# Patient Record
Sex: Female | Born: 1950 | Race: White | Hispanic: No | Smoking: Never smoker
Health system: Southern US, Community
[De-identification: ages and names within clinical notes are randomized; demographics above are authoritative.]

## PROBLEM LIST (undated history)

## (undated) DIAGNOSIS — I10 Essential (primary) hypertension: Secondary | ICD-10-CM

## (undated) DIAGNOSIS — K5792 Diverticulitis of intestine, part unspecified, without perforation or abscess without bleeding: Secondary | ICD-10-CM

## (undated) DIAGNOSIS — E119 Type 2 diabetes mellitus without complications: Secondary | ICD-10-CM

## (undated) HISTORY — PX: CHOLECYSTECTOMY: SHX55

---

## 1999-05-28 DIAGNOSIS — I2510 Atherosclerotic heart disease of native coronary artery without angina pectoris: Secondary | ICD-10-CM | POA: Diagnosis present

## 2006-05-12 ENCOUNTER — Other Ambulatory Visit: Payer: Self-pay

## 2006-05-12 ENCOUNTER — Emergency Department: Payer: Self-pay | Admitting: Emergency Medicine

## 2006-05-14 ENCOUNTER — Ambulatory Visit: Payer: Self-pay | Admitting: Emergency Medicine

## 2006-05-30 ENCOUNTER — Emergency Department: Payer: Self-pay | Admitting: Emergency Medicine

## 2006-05-31 ENCOUNTER — Inpatient Hospital Stay: Payer: Self-pay | Admitting: General Surgery

## 2006-05-31 ENCOUNTER — Other Ambulatory Visit: Payer: Self-pay

## 2010-04-11 ENCOUNTER — Ambulatory Visit: Payer: Self-pay | Admitting: Internal Medicine

## 2012-02-26 ENCOUNTER — Ambulatory Visit: Payer: Self-pay | Admitting: Family Medicine

## 2012-03-06 ENCOUNTER — Ambulatory Visit: Payer: Self-pay | Admitting: Family Medicine

## 2012-09-07 ENCOUNTER — Ambulatory Visit: Payer: Self-pay | Admitting: Family Medicine

## 2013-01-06 ENCOUNTER — Emergency Department: Payer: Self-pay | Admitting: Emergency Medicine

## 2013-01-06 LAB — CBC
HCT: 45.1 % (ref 35.0–47.0)
HGB: 15.8 g/dL (ref 12.0–16.0)
MCH: 31.7 pg (ref 26.0–34.0)
MCHC: 35.1 g/dL (ref 32.0–36.0)

## 2013-01-06 LAB — BASIC METABOLIC PANEL
BUN: 11 mg/dL (ref 7–18)
Co2: 30 mmol/L (ref 21–32)
Creatinine: 0.79 mg/dL (ref 0.60–1.30)
EGFR (African American): >60
Osmolality: 286 (ref 275–301)
Potassium: 3.5 mmol/L (ref 3.5–5.1)

## 2013-01-06 LAB — TROPONIN I
Troponin-I: <0.02 ng/mL
Troponin-I: <0.02 ng/mL

## 2013-03-18 ENCOUNTER — Ambulatory Visit: Payer: Self-pay | Admitting: Family Medicine

## 2013-10-25 DIAGNOSIS — I214 Non-ST elevation (NSTEMI) myocardial infarction: Secondary | ICD-10-CM | POA: Insufficient documentation

## 2013-10-25 DIAGNOSIS — R748 Abnormal levels of other serum enzymes: Secondary | ICD-10-CM | POA: Insufficient documentation

## 2014-10-28 DIAGNOSIS — E782 Mixed hyperlipidemia: Secondary | ICD-10-CM | POA: Insufficient documentation

## 2014-10-28 DIAGNOSIS — I10 Essential (primary) hypertension: Secondary | ICD-10-CM | POA: Insufficient documentation

## 2015-11-13 DIAGNOSIS — E782 Mixed hyperlipidemia: Secondary | ICD-10-CM | POA: Diagnosis not present

## 2015-11-13 DIAGNOSIS — I10 Essential (primary) hypertension: Secondary | ICD-10-CM | POA: Diagnosis not present

## 2020-03-01 ENCOUNTER — Encounter: Payer: Self-pay | Admitting: Podiatry

## 2020-03-01 ENCOUNTER — Ambulatory Visit: Payer: Medicare Other | Admitting: Podiatry

## 2020-03-01 ENCOUNTER — Other Ambulatory Visit: Payer: Self-pay

## 2020-03-01 DIAGNOSIS — T148XXA Other injury of unspecified body region, initial encounter: Secondary | ICD-10-CM | POA: Diagnosis not present

## 2020-03-01 DIAGNOSIS — Z1881 Retained glass fragments: Secondary | ICD-10-CM

## 2020-03-01 DIAGNOSIS — L02612 Cutaneous abscess of left foot: Secondary | ICD-10-CM

## 2020-03-01 DIAGNOSIS — L03032 Cellulitis of left toe: Secondary | ICD-10-CM

## 2020-03-01 DIAGNOSIS — W25XXXA Contact with sharp glass, initial encounter: Secondary | ICD-10-CM

## 2020-03-01 MED ORDER — CEFADROXIL 500 MG PO CAPS: 500.0000 mg | ORAL_CAPSULE | Freq: Two times a day (BID) | ORAL | 0 refills | Status: AC

## 2020-03-01 NOTE — Patient Instructions (Signed)
Monitor for any signs/symptoms of infection. Signs of an infection could be redness beyond the site of the incision/procedure/wound, foul smelling odor, drainage that is thick and yellow or green, or severe swelling and pain. Call the office immediately if any occur or go directly to the emergency room. Call with any questions/concerns.   Apply neosporin or antibiotic ointment and a bandaid daily

## 2020-03-01 NOTE — Progress Notes (Signed)
  Subjective:  Patient ID: Ann Harris, female    DOB: [birth year],  MRN: [id number]  Chief Complaint  Patient presents with  . Callouses    Patient presents today for painful callous lesion bottom of left hallux x 2-3 weeks ago    69 y.o. female presents with the above complaint. History confirmed with patient. Thinks she injured it by stepping on an open suitcase lying on the ground. Pain has been significant  Objective:  Physical Exam: warm, good capillary refill, no trophic changes or ulcerative lesions, normal DP and PT pulses and normal sensory exam. Left Foot: hallux plantar IPJ there is a small blister/abscess with serous drainage, on debridement reveals a small piece of glass, mild erythema and edema of hallux, granular wound base   Assessment:   1. Glass foreign body in skin   2. Cellulitis and abscess of toe of left foot      Plan:  Patient was evaluated and treated and all questions answered.   - Upon debridement of the lesion and exploration of the wound a small piece of glass was easily removed without complication. No further sinus or puncture was noted that warranted further exploration and I feel confident the entire FB was removed  - Rx for duricef for cellulitis x10 days  - Apply neosporin daily with bandage  - Return in 2 weeks for evaluation  Return in about 2 weeks (around 03/15/2020) for wound re-check.

## 2020-03-15 ENCOUNTER — Encounter: Payer: Self-pay | Admitting: Podiatry

## 2020-03-15 ENCOUNTER — Other Ambulatory Visit: Payer: Self-pay

## 2020-03-15 ENCOUNTER — Ambulatory Visit: Payer: Medicare Other | Admitting: Podiatry

## 2020-03-15 DIAGNOSIS — L03032 Cellulitis of left toe: Secondary | ICD-10-CM | POA: Diagnosis not present

## 2020-03-15 DIAGNOSIS — L02612 Cutaneous abscess of left foot: Secondary | ICD-10-CM | POA: Diagnosis not present

## 2020-03-15 DIAGNOSIS — W25XXXA Contact with sharp glass, initial encounter: Secondary | ICD-10-CM | POA: Diagnosis not present

## 2020-03-15 DIAGNOSIS — T148XXA Other injury of unspecified body region, initial encounter: Secondary | ICD-10-CM

## 2020-03-15 NOTE — Progress Notes (Signed)
  Subjective:  Patient ID: Ann Harris, female    DOB: 1950/11/24,  MRN: [id number]  Chief Complaint  Patient presents with  . Wound Check    "its getting better, but now there is a white spot on my toe and it doesn't hurt but its hard to still bend my toe"    70 y.o. female returns for follow-up with the above complaint. History confirmed with patient.  She thinks it may have blistered near the wound.  Feels much better than it did before the glass was taken out.  Objective:  Physical Exam: warm, good capillary refill, no trophic changes or ulcerative lesions, normal DP and PT pulses and normal sensory exam. Left Foot: Prior puncture wound is healing well with mild hyperkeratosis around this, lateral to this there is a small blister   Assessment:   1. Glass foreign body in skin   2. Cellulitis and abscess of toe of left foot      Plan:  Patient was evaluated and treated and all questions answered.   -Deroofed the serous blister and applied Betadine.  Expect this to resolve uneventfully.  Advised to discontinue Neosporin and Band-Aid application and begin to put lotion on this starting tonight.  - Return in 2 weeks for evaluation  Return if symptoms worsen or fail to improve.

## 2020-06-13 ENCOUNTER — Emergency Department
Admission: EM | Admit: 2020-06-13 | Discharge: 2020-06-14 | Disposition: A | Discharge status: Home / Self Care | Payer: Medicare Other | Attending: Emergency Medicine | Admitting: Emergency Medicine

## 2020-06-13 ENCOUNTER — Other Ambulatory Visit: Payer: Self-pay

## 2020-06-13 ENCOUNTER — Emergency Department: Payer: Medicare Other

## 2020-06-13 DIAGNOSIS — Z79899 Other long term (current) drug therapy: Secondary | ICD-10-CM | POA: Diagnosis not present

## 2020-06-13 DIAGNOSIS — E876 Hypokalemia: Secondary | ICD-10-CM | POA: Insufficient documentation

## 2020-06-13 DIAGNOSIS — R739 Hyperglycemia, unspecified: Secondary | ICD-10-CM | POA: Diagnosis not present

## 2020-06-13 DIAGNOSIS — I2510 Atherosclerotic heart disease of native coronary artery without angina pectoris: Secondary | ICD-10-CM | POA: Insufficient documentation

## 2020-06-13 DIAGNOSIS — Z7982 Long term (current) use of aspirin: Secondary | ICD-10-CM | POA: Diagnosis not present

## 2020-06-13 DIAGNOSIS — I119 Hypertensive heart disease without heart failure: Secondary | ICD-10-CM | POA: Diagnosis not present

## 2020-06-13 DIAGNOSIS — K5792 Diverticulitis of intestine, part unspecified, without perforation or abscess without bleeding: Secondary | ICD-10-CM

## 2020-06-13 DIAGNOSIS — R1084 Generalized abdominal pain: Secondary | ICD-10-CM | POA: Insufficient documentation

## 2020-06-13 HISTORY — DX: Essential (primary) hypertension: I10

## 2020-06-13 LAB — CBC
HCT: 44.2 % (ref 36.0–46.0)
Hemoglobin: 15.3 g/dL — ABNORMAL HIGH (ref 12.0–15.0)
MCH: 31.4 pg (ref 26.0–34.0)
MCHC: 34.6 g/dL (ref 30.0–36.0)
MCV: 90.8 fL (ref 80.0–100.0)
Platelets: 197 10*3/uL (ref 150–400)
RBC: 4.87 MIL/uL (ref 3.87–5.11)
RDW: 11.9 % (ref 11.5–15.5)
WBC: 5.9 10*3/uL (ref 4.0–10.5)
nRBC: 0 % (ref 0.0–0.2)

## 2020-06-13 LAB — URINALYSIS, COMPLETE (UACMP) WITH MICROSCOPIC
Bacteria, UA: NONE SEEN
Bilirubin Urine: NEGATIVE
Glucose, UA: >=500 mg/dL — AB
Ketones, ur: NEGATIVE mg/dL
Leukocytes,Ua: NEGATIVE
Nitrite: NEGATIVE
Protein, ur: 30 mg/dL — AB
Specific Gravity, Urine: 1.033 — ABNORMAL HIGH (ref 1.005–1.030)
pH: 5 (ref 5.0–8.0)

## 2020-06-13 LAB — COMPREHENSIVE METABOLIC PANEL
ALT: 24 U/L (ref 0–44)
AST: 28 U/L (ref 15–41)
Albumin: 3.4 g/dL — ABNORMAL LOW (ref 3.5–5.0)
Alkaline Phosphatase: 109 U/L (ref 38–126)
Anion gap: 11 (ref 5–15)
BUN: 11 mg/dL (ref 8–23)
CO2: 29 mmol/L (ref 22–32)
Calcium: 9.2 mg/dL (ref 8.9–10.3)
Chloride: 95 mmol/L — ABNORMAL LOW (ref 98–111)
Creatinine, Ser: 0.64 mg/dL (ref 0.44–1.00)
GFR, Estimated: >60 mL/min (ref >60)
Glucose, Bld: 291 mg/dL — ABNORMAL HIGH (ref 70–99)
Potassium: 2.9 mmol/L — ABNORMAL LOW (ref 3.5–5.1)
Sodium: 135 mmol/L (ref 135–145)
Total Bilirubin: 0.9 mg/dL (ref 0.3–1.2)
Total Protein: 7.6 g/dL (ref 6.5–8.1)

## 2020-06-13 LAB — MAGNESIUM: Magnesium: 1.9 mg/dL (ref 1.7–2.4)

## 2020-06-13 LAB — LACTIC ACID, PLASMA: Lactic Acid, Venous: 1.7 mmol/L (ref 0.5–1.9)

## 2020-06-13 LAB — HEMOGLOBIN A1C
Hgb A1c MFr Bld: 10 % — ABNORMAL HIGH (ref 4.8–5.6)
Mean Plasma Glucose: 240.3 mg/dL

## 2020-06-13 LAB — TROPONIN I (HIGH SENSITIVITY): Troponin I (High Sensitivity): 9 ng/L (ref <18)

## 2020-06-13 LAB — LIPASE, BLOOD: Lipase: 23 U/L (ref 11–51)

## 2020-06-13 MED ORDER — POTASSIUM CHLORIDE CRYS ER 20 MEQ PO TBCR
80.0000 meq | EXTENDED_RELEASE_TABLET | Freq: Once | ORAL | Status: AC
  Administered 2020-06-13: 80 meq via ORAL
  Filled 2020-06-13: qty 4

## 2020-06-13 MED ORDER — CIPROFLOXACIN HCL 500 MG PO TABS
500.0000 mg | ORAL_TABLET | Freq: Once | ORAL | Status: AC
  Administered 2020-06-13: 500 mg via ORAL
  Filled 2020-06-13: qty 1

## 2020-06-13 MED ORDER — LACTATED RINGERS IV BOLUS
500.0000 mL | Freq: Once | INTRAVENOUS | Status: AC
  Administered 2020-06-13: 500 mL via INTRAVENOUS

## 2020-06-13 MED ORDER — CIPROFLOXACIN HCL 500 MG PO TABS: 500.0000 mg | ORAL_TABLET | Freq: Two times a day (BID) | ORAL | 0 refills | Status: AC

## 2020-06-13 MED ORDER — LACTATED RINGERS IV BOLUS
1000.0000 mL | Freq: Once | INTRAVENOUS | Status: AC
  Administered 2020-06-13: 1000 mL via INTRAVENOUS

## 2020-06-13 MED ORDER — ONDANSETRON 4 MG PO TBDP
4.0000 mg | ORAL_TABLET | Freq: Once | ORAL | Status: AC
  Administered 2020-06-13: 4 mg via ORAL
  Filled 2020-06-13: qty 1

## 2020-06-13 MED ORDER — IOHEXOL 300 MG/ML  SOLN
100.0000 mL | Freq: Once | INTRAMUSCULAR | Status: AC | PRN
  Administered 2020-06-13: 100 mL via INTRAVENOUS
  Filled 2020-06-13: qty 100

## 2020-06-13 MED ORDER — METOPROLOL TARTRATE 50 MG PO TABS
50.0000 mg | ORAL_TABLET | Freq: Once | ORAL | Status: AC
  Administered 2020-06-14: 50 mg via ORAL
  Filled 2020-06-13: qty 1

## 2020-06-13 MED ORDER — ONDANSETRON 4 MG PO TBDP: 4.0000 mg | ORAL_TABLET | Freq: Three times a day (TID) | ORAL | 0 refills | Status: DC | PRN

## 2020-06-13 MED ORDER — METRONIDAZOLE 500 MG PO TABS: 500.0000 mg | ORAL_TABLET | Freq: Three times a day (TID) | ORAL | 0 refills | Status: AC

## 2020-06-13 MED ORDER — METFORMIN HCL 500 MG PO TABS: ORAL_TABLET | ORAL | 0 refills | Status: DC

## 2020-06-13 MED ORDER — METRONIDAZOLE 500 MG PO TABS
500.0000 mg | ORAL_TABLET | Freq: Once | ORAL | Status: AC
  Administered 2020-06-13: 500 mg via ORAL
  Filled 2020-06-13: qty 1

## 2020-06-13 NOTE — ED Notes (Signed)
Pt requested to leave prior to completing IVF. Pt sts she will hydrate well at home. Pt able to tolerate water without vomiting and very mild nausea.

## 2020-06-13 NOTE — ED Provider Notes (Signed)
[facility] Emergency Department Provider Note  ____________________________________________   Event Date/Time   First MD Initiated Contact with Patient 06/13/20 1825     (approximate)  I have reviewed the triage vital signs and the nursing notes.   HISTORY  Chief Complaint Abdominal Pain   HPI Ann Harris is a 70 y.o. female with past medical history of HTN, HDL, and CAD with no known history of diabetes who presents for assessment of 3 to 4 days of generalized crampy intermittent abdominal pain.  Patient states he has been having regular bowel movements and has not had any nausea, vomiting or diarrhea.  She denies any urinary symptoms.  Denies any back pain, chest pain, cough, shortness of breath, headache, earache, sore throat, rash or extremity pain.  No recent falls or injuries.  Denies regular NSAID use or EtOH use.  She has had a cholecystectomy in the past but no other significant abdominal surgeries.  No clearly getting aggravating factors.          Past Medical History:  Diagnosis Date  . Hypertension     Patient Active Problem List   Diagnosis Date Noted  . Benign essential hypertension 10/28/2014  . Hyperlipidemia, mixed 10/28/2014  . Low serum HDL 10/25/2013  . Non-Q wave myocardial infarction (HCC) 10/25/2013    Past Surgical History:  Procedure Laterality Date  . CHOLECYSTECTOMY      Prior to Admission medications   Medication Sig Start Date End Date Taking? Authorizing Provider  aspirin 81 MG EC tablet Take by mouth.    [provider]  hydrochlorothiazide (HYDRODIURIL) 25 MG tablet Take by mouth. 02/24/20   [provider]  metoprolol tartrate (LOPRESSOR) 50 MG tablet Take by mouth. 02/24/20   [provider]    Allergies Clopidogrel, Lisinopril, and Losartan  History reviewed. No pertinent family history.  Social History Social History   Tobacco Use  . Smoking status: Never Smoker  .  Smokeless tobacco: Never Used  Substance Use Topics  . Alcohol use: Yes    Comment: occasional glass of wine  . Drug use: Never    Review of Systems  Review of Systems  Constitutional: Negative for chills and fever.  HENT: Negative for sore throat.   Eyes: Negative for pain.  Respiratory: Negative for cough and stridor.   Cardiovascular: Negative for chest pain.  Gastrointestinal: Positive for abdominal pain. Negative for vomiting.  Genitourinary: Negative for dysuria.  Musculoskeletal: Negative for myalgias.  Skin: Negative for rash.  Neurological: Negative for seizures, loss of consciousness and headaches.  Psychiatric/Behavioral: Negative for suicidal ideas.  All other systems reviewed and are negative.    ____________________________________________   PHYSICAL EXAM:  VITAL SIGNS: ED Triage Vitals  Enc Vitals Group     BP 06/13/20 1717 (!) 147/73     Pulse Rate 06/13/20 1717 87     Resp 06/13/20 1717 17     Temp 06/13/20 1717 99.6 F (37.6 C)     Temp Source 06/13/20 1717 Oral     SpO2 06/13/20 1717 93 %     Weight 06/13/20 1717 177 lb (80.3 kg)     Height 06/13/20 1717 5\' 4"  (1.626 m)     Head Circumference --      Peak Flow --      Pain Score 06/13/20 1727 10     Pain Loc --      Pain Edu? --      Excl. in GC? --  Vitals:   06/13/20 1717  BP: (!) 147/73  Pulse: 87  Resp: 17  Temp: 99.6 F (37.6 C)  SpO2: 93%   Physical Exam Vitals and nursing note reviewed.  Constitutional:      General: She is not in acute distress.    Appearance: She is well-developed and well-nourished.  HENT:     Head: Normocephalic and atraumatic.     Right Ear: External ear normal.     Left Ear: External ear normal.     Nose: Nose normal.     Mouth/Throat:     Mouth: Mucous membranes are moist.  Eyes:     Conjunctiva/sclera: Conjunctivae normal.  Cardiovascular:     Rate and Rhythm: Normal rate and regular rhythm.     Heart sounds: No murmur heard.   Pulmonary:      Effort: Pulmonary effort is normal. No respiratory distress.     Breath sounds: Normal breath sounds.  Abdominal:     Palpations: Abdomen is soft.     Tenderness: There is generalized abdominal tenderness. There is no right CVA tenderness or left CVA tenderness.  Musculoskeletal:        General: No edema.     Cervical back: Neck supple.  Skin:    General: Skin is warm and dry.  Neurological:     Mental Status: She is alert and oriented to person, place, and time.  Psychiatric:        Mood and Affect: Mood and affect and mood normal.      ____________________________________________   LABS (all labs ordered are listed, but only abnormal results are displayed)  Labs Reviewed  COMPREHENSIVE METABOLIC PANEL - Abnormal; Notable for the following components:      Result Value   Potassium 2.9 (*)    Chloride 95 (*)    Glucose, Bld 291 (*)    Albumin 3.4 (*)    All other components within normal limits  CBC - Abnormal; Notable for the following components:   Hemoglobin 15.3 (*)    All other components within normal limits  URINALYSIS, COMPLETE (UACMP) WITH MICROSCOPIC - Abnormal; Notable for the following components:   Color, Urine AMBER (*)    APPearance HAZY (*)    Specific Gravity, Urine 1.033 (*)    Glucose, UA >=500 (*)    Hgb urine dipstick MODERATE (*)    Protein, ur 30 (*)    All other components within normal limits  LIPASE, BLOOD  MAGNESIUM  LACTIC ACID, PLASMA  HEMOGLOBIN A1C  TROPONIN I (HIGH SENSITIVITY)   ____________________________________________  EKG  Sinus rhythm with a ventricular rate of 81, normal axis, unremarkable intervals, no clear evidence of acute ischemia. ____________________________________________  RADIOLOGY  ED MD interpretation:   Official radiology report(s): No results found.  ____________________________________________   PROCEDURES  Procedure(s) performed (including Critical  Care):  Procedures   ____________________________________________   INITIAL IMPRESSION / ASSESSMENT AND PLAN / ED COURSE      Patient presents with above to history exam for assessment of worsening generalized crampy abdominal pain has been intermittent over the last couple days.  On arrival she is hypertensive otherwise stable vital signs on room air.  On exam patient appears in no acute distress her abdomen is soft and mildly tender throughout.  Differential includes but is not limited to gastritis, pancreatitis, cholecystitis, cystitis, pyelonephritis, SBO, diverticulitis, appendicitis, enteritis, atypical presentation of ACS, and kidney stone  Lipase of 23 not consistent with acute pancreatitis.  CMP remarkable for hypokalemia  with a K of 2.9 as well as hyperglycemia with a glucose of 291 with no significant electrolyte or metabolic derangements.  No evidence of cholestasis, hepatitis or acidosis.  No evidence of DKA.  Given absence of focal tenderness in right upper quadrant and reassuring CMP low suspicion for choledocholithiasis or other obstructive biliary pathology at this time.  CBC is unremarkable for leukocytosis.  UA has greater than 500 glucose and moderate blood as well as 3 protein but no clear evidence of acute infection.  Low suspicion for cystitis given patient denies any urinary symptoms.  She is no CVA tenderness, fever or elevated white blood cell count to suggest pyelonephritis.  Low suspicion for atypical presentation of ACS given she denies any chest pain and has troponin of 9 pain greater than 3 hours after symptom onset.  Lactic acid is 1.7.  Low suspicion for sepsis perforated viscus or ischemic colitis at this time.  Given hyperglycemia noted on CMP with no history of diabetes A1c was sent.  Patient's potassium was repleted.  She was also given a small bolus of IV fluids.  Care patient signed over to Dr. Erma Heritage at approximately 8 PM.  Plan is to follow-up CT and  reassess.  If CT is reassuring and patient continues to have soft abdomen likely safe for discharge with outpatient PCP follow-up for follow-up of A1c and sugar rechecked.    ____________________________________________   FINAL CLINICAL IMPRESSION(S) / ED DIAGNOSES  Final diagnoses:  Generalized abdominal pain  Hypokalemia  Hyperglycemia    Medications  potassium chloride SA (KLOR-CON) CR tablet 80 mEq (80 mEq Oral Given 06/13/20 1858)  lactated ringers bolus 500 mL (0 mLs Intravenous Stopped 06/13/20 1936)  iohexol (OMNIPAQUE) 300 MG/ML solution 100 mL (100 mLs Intravenous Contrast Given 06/13/20 1944)     ED Discharge Orders    None       Note:  This document was prepared using Dragon voice recognition software and may include unintentional dictation errors.   Gilles Chiquito, MD 06/13/20 [phone]
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
## 2020-06-13 NOTE — Discharge Instructions (Addendum)
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 While this is likely related to infection, your A1C, a marker of diabetes, was elevated as well. This suggests your sugar has been high for the last several months.  For now, we're going to prescribe metformin, a diabetic medication. Start this after the course of antibiotics, as it can cause some stomach/GI upset.  It is VERY important to follow-up with your primary doctor in the next 1 week

## 2020-06-13 NOTE — ED Provider Notes (Signed)
Assumed care of this patient at 9 PM.  Briefly, plan to follow-up CT scan with disposition if no surgical abnormality is noted.  The patient is here with mild abdominal pain, nausea, and reported bloating.  Hypokalemia noted but patient has been given replacement therapy.  She is nontoxic.  Lactic acid is normal without evidence of sepsis.  CT imaging reviewed, shows jejunal diverticulitis.  This was discussed with her in detail and will refer her to GI.  At this time, given absence of any complications or systemic signs of illness or sepsis, will attempt outpatient management with Cipro and Flagyl.  She was given a first dose of medications here.  Of note, her blood sugar was elevated on initial BMP so an A1c was sent, which returned elevated.  She has no evidence of diabetic ketoacidosis today.  She was given fluids here.  Heart rate was initially mildly elevated but this is improved with fluids and she is tolerating p.o.  Suspect this is been a somewhat ongoing issue given her A1c, and will have her follow-up with a PCP.  She was advised to call the [facility] clinic for setting up an appointment.  Do think that she merits treatment with metformin but will have her hold this until after her current episode of diverticulosis is over, to prevent GI effects.    Shaune Pollack, MD 06/13/20 2250

## 2020-06-29 ENCOUNTER — Other Ambulatory Visit: Payer: Self-pay

## 2021-08-14 ENCOUNTER — Other Ambulatory Visit: Payer: Self-pay | Admitting: Internal Medicine

## 2021-08-14 DIAGNOSIS — Z1231 Encounter for screening mammogram for malignant neoplasm of breast: Secondary | ICD-10-CM

## 2021-10-26 ENCOUNTER — Other Ambulatory Visit: Payer: Self-pay

## 2021-10-26 NOTE — Progress Notes (Signed)
Pre-employment drug screen cleared.

## 2022-08-26 IMAGING — CT CT ABD-PELV W/ CM
2 of 5 series · 16 of 46 positions shown, 18 images · IV contrast (APPLIED)
Comparison: None.

CLINICAL DATA: Abdominal pain.

EXAM:
CT ABDOMEN AND PELVIS WITH CONTRAST
TECHNIQUE: Multidetector CT imaging of the abdomen and pelvis was performed
using the standard protocol following bolus administration of
intravenous contrast.
CONTRAST:  100mL OMNIPAQUE IOHEXOL 300 MG/ML  SOLN

[Series 2: axial st · axial · 0.86mm/px · z∈[-720,-250]mm · 13 of 106 slices shown, 15 images]
[im 6/106  soft-tissue]
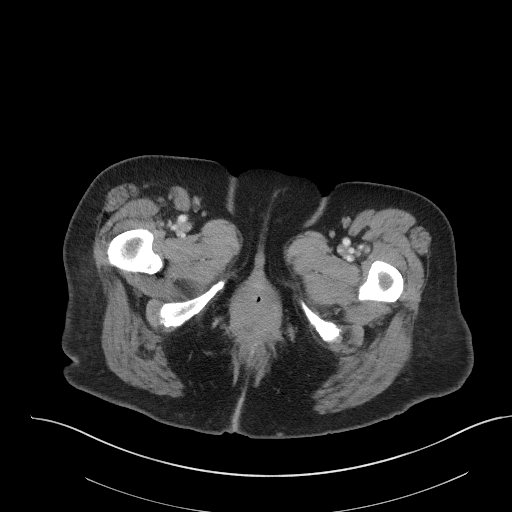
[im 6/106  bone]
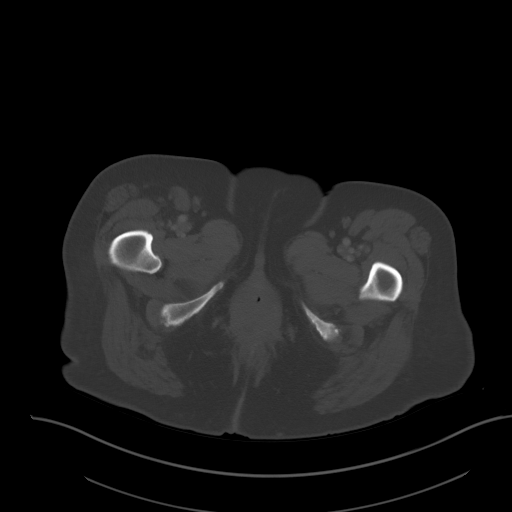
[im 17/106  soft-tissue]
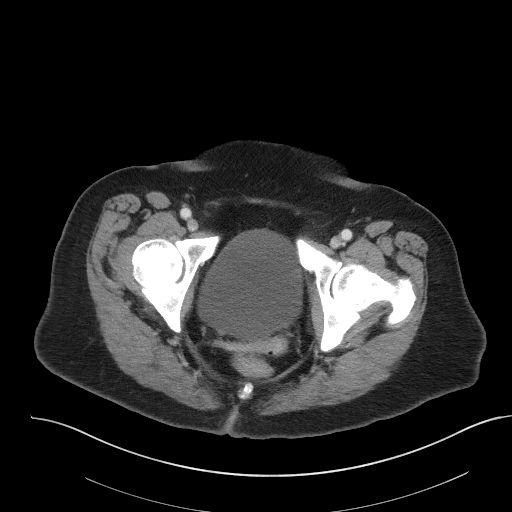
[im 23/106  soft-tissue]
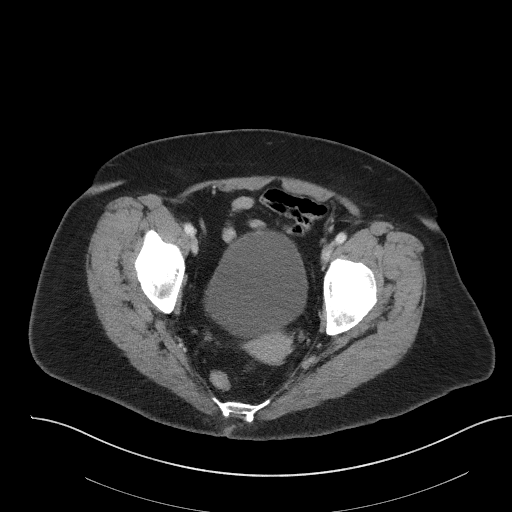
[im 28/106  soft-tissue]
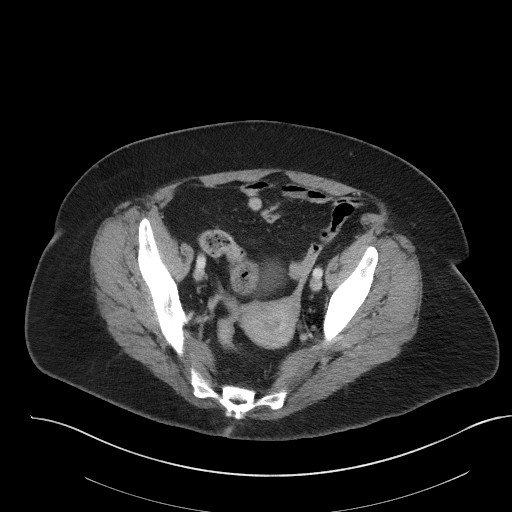
[im 39/106  soft-tissue]
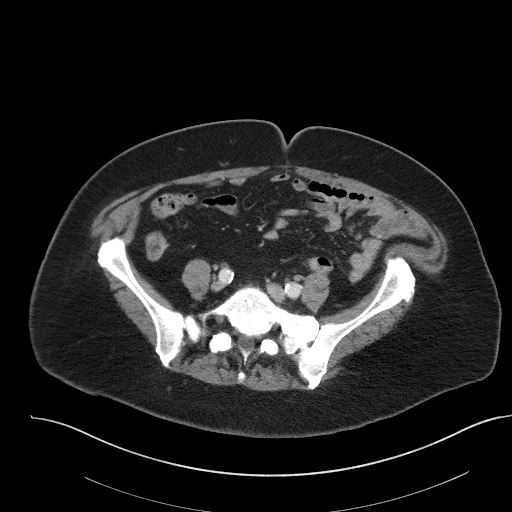
[im 45/106  soft-tissue]
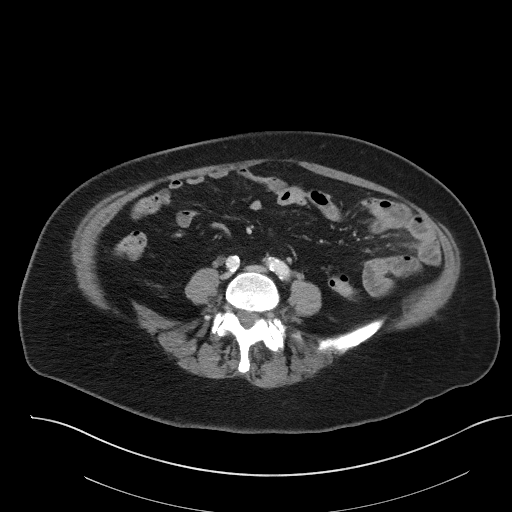
[im 56/106  soft-tissue]
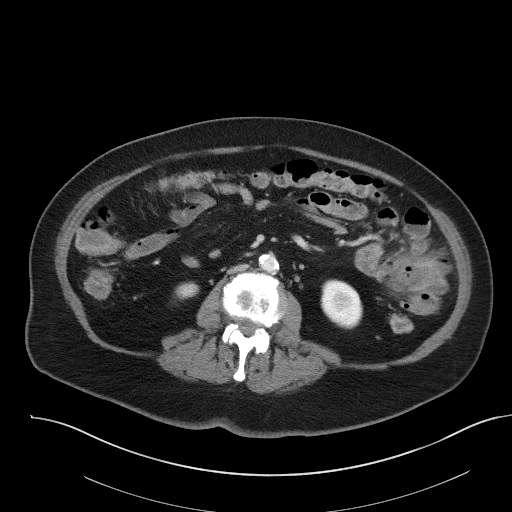
[im 61/106  soft-tissue]
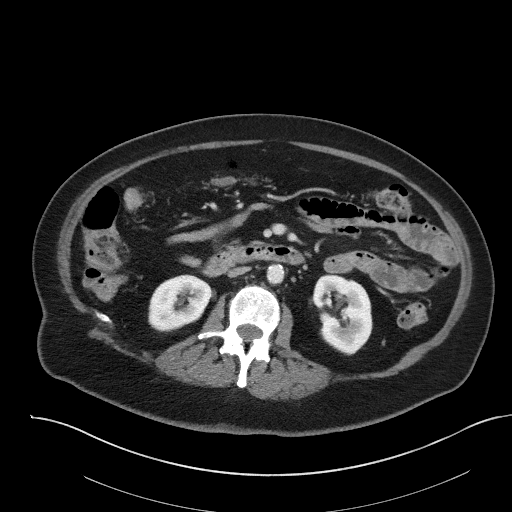
[im 67/106  soft-tissue]
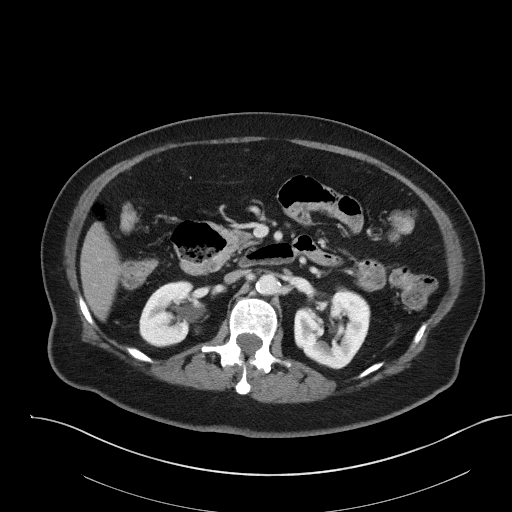
[im 67/106  bone]
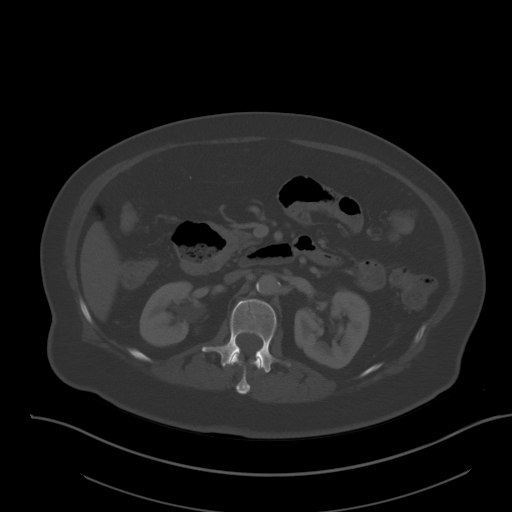
[im 78/106  soft-tissue]
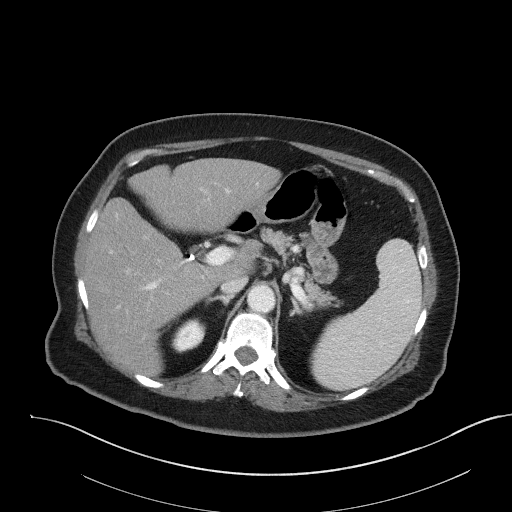
[im 83/106  soft-tissue]
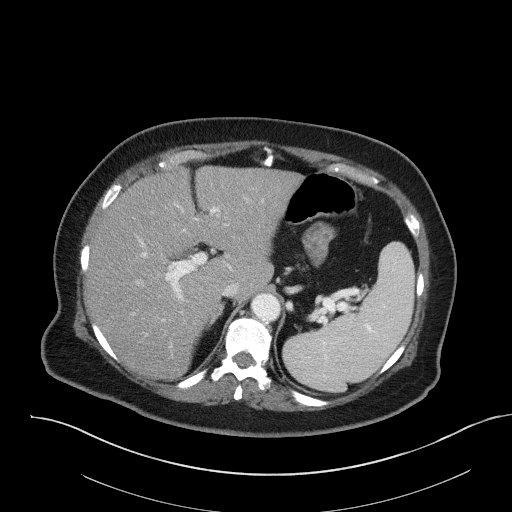
[im 89/106  soft-tissue]
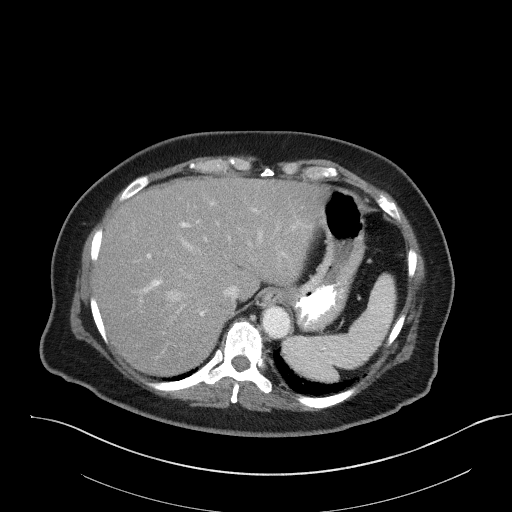
[im 100/106  soft-tissue]
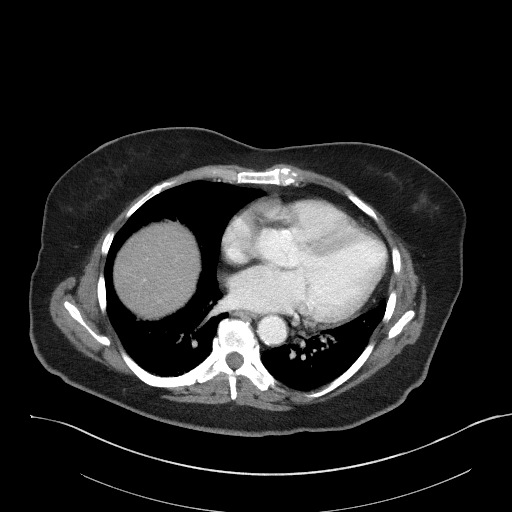

[Series 5: coronal st · coronal · 0.82mm/px · 3 of 88 slices shown]
[im 30/88  soft-tissue]
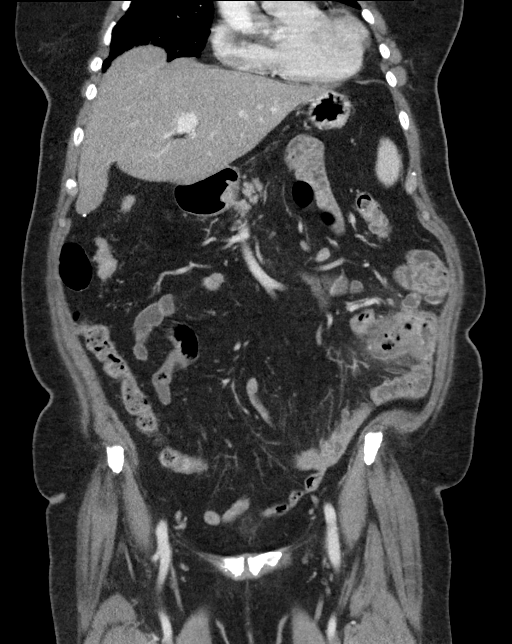
[im 39/88  soft-tissue]
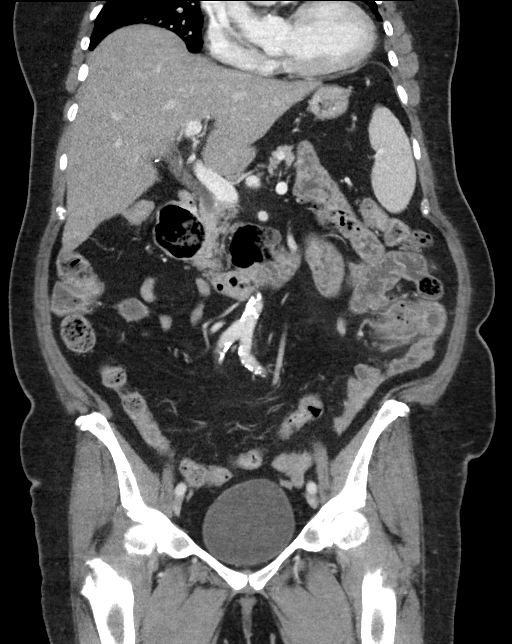
[im 49/88  soft-tissue]
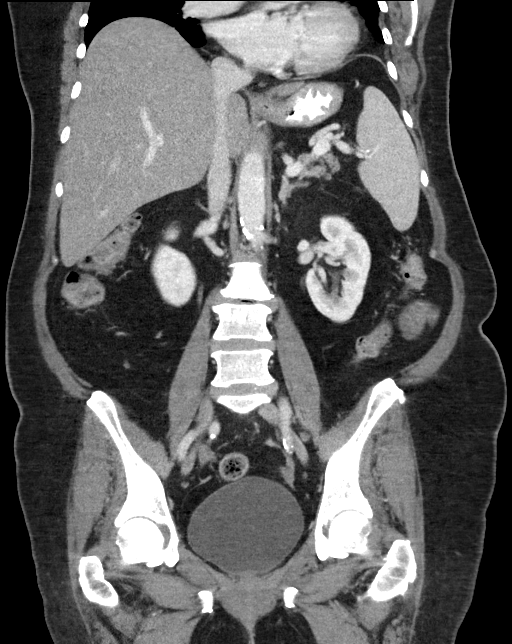

[16 of 46 positions shown; findings below may reference images not displayed]

FINDINGS: Lower chest: No acute abnormality.

Hepatobiliary: There is mild diffuse fatty infiltration of the liver
parenchyma. No focal liver abnormality is seen. Status post
cholecystectomy. No biliary dilatation.

Pancreas: Unremarkable. No pancreatic ductal dilatation or
surrounding inflammatory changes.

Spleen: Normal in size without focal abnormality.

Adrenals/Urinary Tract: Adrenal glands are unremarkable. Kidneys are
normal, without renal calculi, focal lesion, or hydronephrosis.
Bladder is unremarkable.

Stomach/Bowel: Stomach is within normal limits. Appendix appears
normal. No evidence of bowel dilatation. A large, markedly thickened
and inflamed jejunal diverticulum is seen within the lateral aspect
of the mid left abdomen (axial CT images 51 through 60, CT series
number 2). This is adjacent to a segment of mildly thickened small
bowel.

Vascular/Lymphatic: Aortic atherosclerosis. No enlarged abdominal or
pelvic lymph nodes.

Reproductive: Uterus and bilateral adnexa are unremarkable.

Other: No abdominal wall hernia or abnormality. No abdominopelvic
ascites.

Musculoskeletal: Degenerative changes are seen within the lumbar
spine, most prominent at the level of L2-L3.
IMPRESSION: 1. Large, markedly thickened and inflamed jejunal diverticulum.
2. Evidence of prior cholecystectomy.
3. Aortic atherosclerosis.

Aortic Atherosclerosis ([id number]-[id number].A).

## 2023-09-15 ENCOUNTER — Other Ambulatory Visit: Payer: Self-pay | Admitting: Internal Medicine

## 2023-09-15 DIAGNOSIS — Z1231 Encounter for screening mammogram for malignant neoplasm of breast: Secondary | ICD-10-CM

## 2023-09-30 ENCOUNTER — Ambulatory Visit
Admission: RE | Admit: 2023-09-30 | Discharge: 2023-09-30 | Disposition: A | Discharge status: Home / Self Care | Source: Ambulatory Visit | Attending: Internal Medicine | Admitting: Internal Medicine

## 2023-09-30 DIAGNOSIS — Z1231 Encounter for screening mammogram for malignant neoplasm of breast: Secondary | ICD-10-CM | POA: Insufficient documentation

## 2023-11-13 ENCOUNTER — Emergency Department

## 2023-11-13 ENCOUNTER — Encounter: Payer: Self-pay | Admitting: Emergency Medicine

## 2023-11-13 ENCOUNTER — Inpatient Hospital Stay
Admission: EM | Admit: 2023-11-13 | Discharge: 2023-11-16 | DRG: 392 | Disposition: A | Discharge status: Home / Self Care | Attending: Student in an Organized Health Care Education/Training Program | Admitting: Student in an Organized Health Care Education/Training Program

## 2023-11-13 ENCOUNTER — Other Ambulatory Visit: Payer: Self-pay

## 2023-11-13 DIAGNOSIS — I2510 Atherosclerotic heart disease of native coronary artery without angina pectoris: Secondary | ICD-10-CM | POA: Diagnosis present

## 2023-11-13 DIAGNOSIS — Z79899 Other long term (current) drug therapy: Secondary | ICD-10-CM

## 2023-11-13 DIAGNOSIS — Z9049 Acquired absence of other specified parts of digestive tract: Secondary | ICD-10-CM | POA: Diagnosis not present

## 2023-11-13 DIAGNOSIS — J9601 Acute respiratory failure with hypoxia: Secondary | ICD-10-CM | POA: Diagnosis not present

## 2023-11-13 DIAGNOSIS — K5710 Diverticulosis of small intestine without perforation or abscess without bleeding: Secondary | ICD-10-CM

## 2023-11-13 DIAGNOSIS — Z7984 Long term (current) use of oral hypoglycemic drugs: Secondary | ICD-10-CM | POA: Diagnosis not present

## 2023-11-13 DIAGNOSIS — E119 Type 2 diabetes mellitus without complications: Secondary | ICD-10-CM | POA: Diagnosis present

## 2023-11-13 DIAGNOSIS — I700 Atherosclerosis of aorta: Secondary | ICD-10-CM | POA: Diagnosis present

## 2023-11-13 DIAGNOSIS — Z7982 Long term (current) use of aspirin: Secondary | ICD-10-CM

## 2023-11-13 DIAGNOSIS — K5720 Diverticulitis of large intestine with perforation and abscess without bleeding: Secondary | ICD-10-CM | POA: Diagnosis present

## 2023-11-13 DIAGNOSIS — E876 Hypokalemia: Secondary | ICD-10-CM | POA: Diagnosis present

## 2023-11-13 DIAGNOSIS — Z888 Allergy status to other drugs, medicaments and biological substances status: Secondary | ICD-10-CM

## 2023-11-13 DIAGNOSIS — J9811 Atelectasis: Secondary | ICD-10-CM | POA: Diagnosis not present

## 2023-11-13 DIAGNOSIS — R1010 Upper abdominal pain, unspecified: Principal | ICD-10-CM

## 2023-11-13 DIAGNOSIS — I10 Essential (primary) hypertension: Secondary | ICD-10-CM | POA: Diagnosis present

## 2023-11-13 DIAGNOSIS — K5700 Diverticulitis of small intestine with perforation and abscess without bleeding: Principal | ICD-10-CM | POA: Diagnosis present

## 2023-11-13 HISTORY — DX: Diverticulitis of intestine, part unspecified, without perforation or abscess without bleeding: K57.92

## 2023-11-13 HISTORY — DX: Type 2 diabetes mellitus without complications: E11.9

## 2023-11-13 LAB — URINALYSIS, ROUTINE W REFLEX MICROSCOPIC
Bilirubin Urine: NEGATIVE
Glucose, UA: NEGATIVE mg/dL
Hgb urine dipstick: NEGATIVE
Ketones, ur: NEGATIVE mg/dL
Nitrite: POSITIVE — AB
Protein, ur: NEGATIVE mg/dL
Specific Gravity, Urine: 1.018 (ref 1.005–1.030)
pH: 6 (ref 5.0–8.0)

## 2023-11-13 LAB — GLUCOSE, CAPILLARY
Glucose-Capillary: 124 mg/dL — ABNORMAL HIGH (ref 70–99)
Glucose-Capillary: 115 mg/dL — ABNORMAL HIGH (ref 70–99)

## 2023-11-13 LAB — TROPONIN I (HIGH SENSITIVITY)
Troponin I (High Sensitivity): 6 ng/L (ref <18)
Troponin I (High Sensitivity): 6 ng/L (ref <18)

## 2023-11-13 LAB — CBC WITH DIFFERENTIAL/PLATELET
Abs Immature Granulocytes: 0.03 10*3/uL (ref 0.00–0.07)
Basophils Absolute: 0 10*3/uL (ref 0.0–0.1)
Basophils Relative: 0 %
Eosinophils Absolute: 0.1 10*3/uL (ref 0.0–0.5)
Eosinophils Relative: 2 %
HCT: 47.7 % — ABNORMAL HIGH (ref 36.0–46.0)
Hemoglobin: 16.1 g/dL — ABNORMAL HIGH (ref 12.0–15.0)
Immature Granulocytes: 0 %
Lymphocytes Relative: 20 %
Lymphs Abs: 1.7 10*3/uL (ref 0.7–4.0)
MCH: 31.3 pg (ref 26.0–34.0)
MCHC: 33.8 g/dL (ref 30.0–36.0)
MCV: 92.6 fL (ref 80.0–100.0)
Monocytes Absolute: 0.3 10*3/uL (ref 0.1–1.0)
Monocytes Relative: 3 %
Neutro Abs: 6.4 10*3/uL (ref 1.7–7.7)
Neutrophils Relative %: 75 %
Platelets: 188 10*3/uL (ref 150–400)
RBC: 5.15 MIL/uL — ABNORMAL HIGH (ref 3.87–5.11)
RDW: 12.6 % (ref 11.5–15.5)
WBC: 8.5 10*3/uL (ref 4.0–10.5)
nRBC: 0 % (ref 0.0–0.2)

## 2023-11-13 LAB — COMPREHENSIVE METABOLIC PANEL WITH GFR
ALT: 22 U/L (ref 0–44)
AST: 21 U/L (ref 15–41)
Albumin: 4.1 g/dL (ref 3.5–5.0)
Alkaline Phosphatase: 43 U/L (ref 38–126)
Anion gap: 10 (ref 5–15)
BUN: 12 mg/dL (ref 8–23)
CO2: 30 mmol/L (ref 22–32)
Calcium: 9.9 mg/dL (ref 8.9–10.3)
Chloride: 101 mmol/L (ref 98–111)
Creatinine, Ser: 0.63 mg/dL (ref 0.44–1.00)
GFR, Estimated: >60 mL/min (ref >60)
Glucose, Bld: 133 mg/dL — ABNORMAL HIGH (ref 70–99)
Potassium: 2.9 mmol/L — ABNORMAL LOW (ref 3.5–5.1)
Sodium: 141 mmol/L (ref 135–145)
Total Bilirubin: 0.8 mg/dL (ref 0.0–1.2)
Total Protein: 7.3 g/dL (ref 6.5–8.1)

## 2023-11-13 LAB — CBG MONITORING, ED: Glucose-Capillary: 140 mg/dL — ABNORMAL HIGH (ref 70–99)

## 2023-11-13 LAB — LIPASE, BLOOD: Lipase: 34 U/L (ref 11–51)

## 2023-11-13 LAB — MAGNESIUM: Magnesium: 2 mg/dL (ref 1.7–2.4)

## 2023-11-13 MED ORDER — SODIUM CHLORIDE 0.9 % IV BOLUS (SEPSIS)
1000.0000 mL | Freq: Once | INTRAVENOUS | Status: AC
  Administered 2023-11-13: 1000 mL via INTRAVENOUS

## 2023-11-13 MED ORDER — PIPERACILLIN-TAZOBACTAM 3.375 G IVPB
3.3750 g | Freq: Once | INTRAVENOUS | Status: AC
  Administered 2023-11-13: 3.375 g via INTRAVENOUS
  Filled 2023-11-13: qty 50

## 2023-11-13 MED ORDER — ONDANSETRON HCL 4 MG/2ML IJ SOLN
4.0000 mg | Freq: Once | INTRAMUSCULAR | Status: AC
  Administered 2023-11-13: 4 mg via INTRAVENOUS
  Filled 2023-11-13: qty 2

## 2023-11-13 MED ORDER — KETOROLAC TROMETHAMINE 30 MG/ML IJ SOLN
15.0000 mg | Freq: Once | INTRAMUSCULAR | Status: AC
  Administered 2023-11-13: 15 mg via INTRAVENOUS
  Filled 2023-11-13: qty 1

## 2023-11-13 MED ORDER — MORPHINE SULFATE (PF) 2 MG/ML IV SOLN: 2.0000 mg | INTRAVENOUS | Status: DC | PRN

## 2023-11-13 MED ORDER — HYDRALAZINE HCL 20 MG/ML IJ SOLN: 10.0000 mg | INTRAMUSCULAR | Status: DC | PRN

## 2023-11-13 MED ORDER — HYDROCHLOROTHIAZIDE 25 MG PO TABS
25.0000 mg | ORAL_TABLET | Freq: Every day | ORAL | Status: DC
  Administered 2023-11-13 – 2023-11-16 (×4): 25 mg via ORAL
  Filled 2023-11-13 (×5): qty 1

## 2023-11-13 MED ORDER — OXYCODONE HCL 5 MG PO TABS
5.0000 mg | ORAL_TABLET | ORAL | Status: DC | PRN
  Administered 2023-11-13: 10 mg via ORAL
  Administered 2023-11-14: 5 mg via ORAL
  Administered 2023-11-14 – 2023-11-15 (×2): 10 mg via ORAL
  Filled 2023-11-13 (×4): qty 2

## 2023-11-13 MED ORDER — OXYCODONE HCL 5 MG PO TABS
5.0000 mg | ORAL_TABLET | Freq: Once | ORAL | Status: AC
  Administered 2023-11-13: 5 mg via ORAL
  Filled 2023-11-13: qty 1

## 2023-11-13 MED ORDER — INSULIN ASPART 100 UNIT/ML IJ SOLN
0.0000 [IU] | Freq: Three times a day (TID) | INTRAMUSCULAR | Status: DC
  Administered 2023-11-14: 2 [IU] via SUBCUTANEOUS
  Administered 2023-11-14: 3 [IU] via SUBCUTANEOUS
  Filled 2023-11-13 (×2): qty 1

## 2023-11-13 MED ORDER — POTASSIUM CHLORIDE 10 MEQ/100ML IV SOLN
10.0000 meq | INTRAVENOUS | Status: AC
  Administered 2023-11-13 (×2): 10 meq via INTRAVENOUS
  Filled 2023-11-13 (×2): qty 100

## 2023-11-13 MED ORDER — IOHEXOL 300 MG/ML  SOLN
100.0000 mL | Freq: Once | INTRAMUSCULAR | Status: AC | PRN
  Administered 2023-11-13: 100 mL via INTRAVENOUS

## 2023-11-13 MED ORDER — LACTATED RINGERS IV SOLN: INTRAVENOUS | Status: DC

## 2023-11-13 MED ORDER — ACETAMINOPHEN 325 MG PO TABS
650.0000 mg | ORAL_TABLET | Freq: Four times a day (QID) | ORAL | Status: DC | PRN
  Administered 2023-11-13 – 2023-11-15 (×2): 650 mg via ORAL
  Filled 2023-11-13 (×2): qty 2

## 2023-11-13 MED ORDER — ONDANSETRON HCL 4 MG/2ML IJ SOLN: 4.0000 mg | Freq: Four times a day (QID) | INTRAMUSCULAR | Status: DC | PRN

## 2023-11-13 MED ORDER — ACETAMINOPHEN 650 MG RE SUPP: 650.0000 mg | Freq: Four times a day (QID) | RECTAL | Status: DC | PRN

## 2023-11-13 MED ORDER — ONDANSETRON 4 MG PO TBDP: 4.0000 mg | ORAL_TABLET | Freq: Four times a day (QID) | ORAL | Status: DC | PRN

## 2023-11-13 MED ORDER — PIPERACILLIN-TAZOBACTAM 3.375 G IVPB
3.3750 g | Freq: Three times a day (TID) | INTRAVENOUS | Status: DC
  Administered 2023-11-13 – 2023-11-16 (×9): 3.375 g via INTRAVENOUS
  Filled 2023-11-13 (×10): qty 50

## 2023-11-13 MED ORDER — ASPIRIN 81 MG PO TBEC
81.0000 mg | DELAYED_RELEASE_TABLET | Freq: Every day | ORAL | Status: DC
  Administered 2023-11-13 – 2023-11-16 (×4): 81 mg via ORAL
  Filled 2023-11-13 (×5): qty 1

## 2023-11-13 MED ORDER — POTASSIUM CHLORIDE CRYS ER 20 MEQ PO TBCR
40.0000 meq | EXTENDED_RELEASE_TABLET | Freq: Once | ORAL | Status: AC
  Administered 2023-11-13: 40 meq via ORAL
  Filled 2023-11-13: qty 2

## 2023-11-13 MED ORDER — METOPROLOL TARTRATE 50 MG PO TABS
50.0000 mg | ORAL_TABLET | Freq: Two times a day (BID) | ORAL | Status: DC
  Administered 2023-11-13 – 2023-11-16 (×7): 50 mg via ORAL
  Filled 2023-11-13 (×8): qty 1

## 2023-11-13 NOTE — ED Notes (Signed)
 Pt to radiology.

## 2023-11-13 NOTE — ED Provider Notes (Signed)
 7:44 AM Assumed care for off going team.   Blood pressure (!) 174/86, pulse 65, temperature 98.3 F (36.8 C), temperature source Oral, resp. rate 16, height 5' 3.5 (1.613 m), weight 75.8 kg, SpO2 98%.  See their HPI for full report but in brief pending CT  I reviewed CT personally interpreted and patient does have diverticulitis waiting for read.  1. Examination is positive for acute diverticulitis involving the  mid small bowel. Small foci of extraluminal gas noted within the  area of inflammation which may reflect contained perforation. No  signs of free perforation noted however. No abscess identified.  2. Small volume of free fluid noted within the abdomen and pelvis.  3. Stable appearance of large duodenal diverticulum.  4. Status post cholecystectomy. Stable dilatation of the common bile  duct without significant intrahepatic bile duct dilatation. Findings  likely reflect post cholecystectomy physiology.  5.  Aortic Atherosclerosis (ICD10-I70.0).    I did let Dr. Mauri Sous know about patient.  Her repeat abdominal exam is without rebound, guarding.  Does report 5 out of 10 pain.  Will give a dose of oxycodone.  Given the contained perforation I did recommend admission to the hospital for serial abdominal exams, IV antibiotics.  Patient was agreeable.  Will discuss the hospitalist for admission       Lubertha Rush, MD 11/13/23 [phone]

## 2023-11-13 NOTE — Consult Note (Addendum)
 [facility] SURGICAL CONSULTATION NOTE (initial) - cpt: 96045   HISTORY OF PRESENT ILLNESS (HPI):  73 y.o. female presented to [facility] ED today for evaluation of abdominal pain. Patient reports she awoke this morning around 0100 with central abdominal discomfort. At first, she thought this may be food poisoning from dinner last night which was lobster ravioli. However, the pain continued to progress which prompted her presentation to the ED. She denied any fever, chills, emesis, CP, SOB, or urinary changes. Of note, she has a history of small bowel diverticulitis back in 2022 which was managed with PO Abx. She thinks she has had a few flare ups since then but never needed Abx or hospitalization. Previous abdominal surgeries positive for open cholecystectomy. She is colonoscopy naive. Work up in the ED revealed a normal WBC to 8.5K, Hgb to 16.1, sCr - 0.63, hypokalemia to 2.9. She did have CT Abdomen/Pelvis which was concerning for small bowel diverticulitis with very small foci of air, no massive pneumoperitoneum.   Surgery is consulted by emergency medicine physician Dr. Mike Alcon, MD in this context for evaluation and management of small bowel diverticulitis.   PAST MEDICAL HISTORY (PMH):  Past Medical History:  Diagnosis Date   Hypertension      PAST SURGICAL HISTORY (PSH):  Past Surgical History:  Procedure Laterality Date   CHOLECYSTECTOMY       MEDICATIONS:  Prior to Admission medications   Medication Sig Start Date End Date Taking? Authorizing Provider  aspirin 81 MG EC tablet Take by mouth.    [provider]  hydrochlorothiazide (HYDRODIURIL) 25 MG tablet Take by mouth. 02/24/20   [provider]  metFORMIN  (GLUCOPHAGE ) 500 MG tablet Take 1 tablet (500 mg total) by mouth 2 (two) times daily with a meal for 7 days, THEN 2 tablets (1,000 mg total) 2 (two) times daily with a meal for 23 days. You can increase to 1000 mg twice a day if you have no  abdominal pain, diarrhea, or other side effects.. 06/13/20 07/13/20  Loman Risk, MD  metoprolol  tartrate (LOPRESSOR ) 50 MG tablet Take by mouth. 02/24/20   [provider]  ondansetron  (ZOFRAN  ODT) 4 MG disintegrating tablet Take 1 tablet (4 mg total) by mouth every 8 (eight) hours as needed for nausea or vomiting. 06/13/20   Loman Risk, MD     ALLERGIES:  Allergies  Allergen Reactions   Clopidogrel Other (See Comments)    bruising    Lisinopril Cough    headaches   Losartan Diarrhea and Other (See Comments)    Sensitivity to light      SOCIAL HISTORY:  Social History   Socioeconomic History   Marital status: Widowed    Spouse name: Not on file   Number of children: Not on file   Years of education: Not on file   Highest education level: Not on file  Occupational History   Not on file  Tobacco Use   Smoking status: Never   Smokeless tobacco: Never  Substance and Sexual Activity   Alcohol use: Yes    Comment: occasional glass of wine   Drug use: Never   Sexual activity: Not on file  Other Topics Concern   Not on file  Social History Narrative   Not on file   Social Drivers of Health   Financial Resource Strain: Low Risk  (05/04/2023)   Received from [facility]   Overall Financial Resource Strain (CARDIA)    Difficulty of Paying Living  Expenses: Not hard at all  Food Insecurity: No Food Insecurity (05/04/2023)   Received from [facility]   Hunger Vital Sign    Within the past 12 months, you worried that your food would run out before you got the money to buy more.: Never true    Within the past 12 months, the food you bought just didn't last and you didn't have money to get more.: Never true  Transportation Needs: No Transportation Needs (05/04/2023)   Received from [facility] - Transportation    In the past 12 months, has lack of transportation kept you from medical appointments or from  getting medications?: No    Lack of Transportation (Non-Medical): No  Physical Activity: Not on file  Stress: Not on file  Social Connections: Not on file  Intimate Partner Violence: Not on file     FAMILY HISTORY:  History reviewed. No pertinent family history.    REVIEW OF SYSTEMS:  Review of Systems  Constitutional:  Negative for chills and fever.  Respiratory:  Negative for cough and shortness of breath.   Cardiovascular:  Negative for chest pain and palpitations.  Gastrointestinal:  Positive for abdominal pain. Negative for constipation, diarrhea, nausea and vomiting.  Genitourinary:  Negative for dysuria and urgency.  All other systems reviewed and are negative.   VITAL SIGNS:  Temp:  [98.3 F (36.8 C)] 98.3 F (36.8 C) (06/19 0547) Pulse Rate:  [65-87] 87 (06/19 0700) Resp:  [16-20] 20 (06/19 0700) BP: (125-174)/(76-86) 125/76 (06/19 0700) SpO2:  [93 %-98 %] 93 % (06/19 0700) Weight:  [75.8 kg] 75.8 kg (06/19 0528)     Height: 5' 3.5 (161.3 cm) Weight: 75.8 kg BMI (Calculated): 29.12   INTAKE/OUTPUT:  No intake/output data recorded.  PHYSICAL EXAM:  Physical Exam Vitals and nursing note reviewed. Exam conducted with a chaperone present.  Constitutional:      General: She is not in acute distress.    Appearance: She is well-developed and normal weight. She is not ill-appearing.     Comments: Sitting up in bed; NAD  HENT:     Head: Normocephalic and atraumatic.   Eyes:     General: No scleral icterus.    Extraocular Movements: Extraocular movements intact.    Cardiovascular:     Rate and Rhythm: Normal rate.     Heart sounds: Normal heart sounds. No murmur heard. Pulmonary:     Effort: Pulmonary effort is normal. No respiratory distress.  Abdominal:     General: A surgical scar is present. There is no distension.     Palpations: Abdomen is soft.     Tenderness: There is abdominal tenderness in the periumbilical area. There is no guarding or rebound.      Comments: Abdomen is soft, she is tender periumbilically, non-distended, no rebound/guarding. She is not overtly peritonitic  Genitourinary:    Comments: Deferred  Skin:    General: Skin is warm and dry.   Neurological:     General: No focal deficit present.     Mental Status: She is alert and oriented to person, place, and time.   Psychiatric:        Attention and Perception: Attention normal.        Mood and Affect: Mood is anxious.      Labs:     Latest Ref Rng & Units 11/13/2023    5:52 AM 06/13/2020    5:30 PM 01/06/2013    8:37 AM  CBC  WBC 4.0 - 10.5 K/uL 8.5  5.9  5.9   Hemoglobin 12.0 - 15.0 g/dL 03.4  74.2  59.5   Hematocrit 36.0 - 46.0 % 47.7  44.2  45.1   Platelets 150 - 400 K/uL 188  197  168       Latest Ref Rng & Units 11/13/2023    5:52 AM 06/13/2020    5:30 PM 01/06/2013    8:37 AM  CMP  Glucose 70 - 99 mg/dL 638  756  433   BUN 8 - 23 mg/dL 12  11  11    Creatinine 0.44 - 1.00 mg/dL 2.95  1.88  4.16   Sodium 135 - 145 mmol/L 141  135  143   Potassium 3.5 - 5.1 mmol/L 2.9  2.9  3.5   Chloride 98 - 111 mmol/L 101  95  108   CO2 22 - 32 mmol/L 30  29  30    Calcium 8.9 - 10.3 mg/dL 9.9  9.2  9.3   Total Protein 6.5 - 8.1 g/dL 7.3  7.6    Total Bilirubin 0.0 - 1.2 mg/dL 0.8  0.9    Alkaline Phos 38 - 126 U/L 43  109    AST 15 - 41 U/L 21  28    ALT 0 - 44 U/L 22  24      Imaging studies:   CT Abdomen/Pelvis (11/13/2023) personally reviewed with small bowel diverticulitis with small foci of extraluminal air, no massive pneumoperitoneum, no abscess, and radiologist report reviewed below: IMPRESSION: 1. Examination is positive for acute diverticulitis involving the mid small bowel. Small foci of extraluminal gas noted within the area of inflammation which may reflect contained perforation. No signs of free perforation noted however. No abscess identified. 2. Small volume of free fluid noted within the abdomen and pelvis. 3. Stable appearance of large  duodenal diverticulum. 4. Status post cholecystectomy. Stable dilatation of the common bile duct without significant intrahepatic bile duct dilatation. Findings likely reflect post cholecystectomy physiology. 5.  Aortic Atherosclerosis (ICD10-I70.0).   Assessment/Plan:  73 y.o. female with small bowel diverticulitis with small foci of extraluminal air without peritonitis   - Greatly appreciate medicine admission - Her examination, hemodynamics, and laboratory work up are all reassuring. I think it is reasonable for now to manage this conservatively. She understands that if she fails to improve or deteriorates in any fashion, we will need to consider emergent surgical intervention.    - Recommend NPO for now; okay sips with medications - IV Abx (Zosyn) - IVF support  - Monitor abdominal examination; on-going bowel function  - Pain control prn; antiemetics prn   - Morning labs; CBC, BMP  - Mobilize as tolerated   - Further management per primary service; we will follow along   All of the above findings and recommendations were discussed with the patient, and all of patient's questions were answered to her expressed satisfaction.  Thank you for the opportunity to participate in this patient's care.   -- Apolonio Bay, PA-C [facility] 11/13/2023, 8:58 AM M-F: 7am - 4pm

## 2023-11-13 NOTE — Plan of Care (Signed)
  Problem: Education: Goal: Ability to describe self-care measures that may prevent or decrease complications (Diabetes Survival Skills Education) will improve Outcome: Progressing   Problem: Coping: Goal: Ability to adjust to condition or change in health will improve Outcome: Progressing

## 2023-11-13 NOTE — ED Provider Notes (Signed)
 [facility] Provider Note    Event Date/Time   First MD Initiated Contact with Patient 11/13/23 [phone]     (approximate)   History   Abdominal Pain   HPI  Ann Harris is a 73 y.o. female with history of hypertension, diabetes, prior cholecystectomy who presents to the emergency department with upper abdominal pain, nausea that started tonight.  No vomiting, diarrhea.  Did take aspirin prior to arrival.  No known fevers.  No chest pain or shortness of breath.  No bloody stools, melena, dysuria, hematuria, vaginal bleeding or discharge.  History of jejunal diverticulitis in 2022 and states this feels similar.   History provided by patient.    Past Medical History:  Diagnosis Date   Hypertension     Past Surgical History:  Procedure Laterality Date   CHOLECYSTECTOMY      MEDICATIONS:  Prior to Admission medications   Medication Sig Start Date End Date Taking? Authorizing Provider  aspirin 81 MG EC tablet Take by mouth.    [provider]  hydrochlorothiazide (HYDRODIURIL) 25 MG tablet Take by mouth. 02/24/20   [provider]  metFORMIN  (GLUCOPHAGE ) 500 MG tablet Take 1 tablet (500 mg total) by mouth 2 (two) times daily with a meal for 7 days, THEN 2 tablets (1,000 mg total) 2 (two) times daily with a meal for 23 days. You can increase to 1000 mg twice a day if you have no abdominal pain, diarrhea, or other side effects.. 06/13/20 07/13/20  Loman Risk, MD  metoprolol  tartrate (LOPRESSOR ) 50 MG tablet Take by mouth. 02/24/20   [provider]  ondansetron  (ZOFRAN  ODT) 4 MG disintegrating tablet Take 1 tablet (4 mg total) by mouth every 8 (eight) hours as needed for nausea or vomiting. 06/13/20   Loman Risk, MD    Physical Exam   Triage Vital Signs: ED Triage Vitals  Encounter Vitals Group     BP 11/13/23 0547 (!) 174/86     Girls Systolic BP Percentile --      Girls Diastolic BP Percentile --      Boys Systolic  BP Percentile --      Boys Diastolic BP Percentile --      Pulse Rate 11/13/23 0547 65     Resp 11/13/23 0547 16     Temp 11/13/23 0547 98.3 F (36.8 C)     Temp Source 11/13/23 0547 Oral     SpO2 11/13/23 0547 98 %     Weight 11/13/23 0528 167 lb (75.8 kg)     Height 11/13/23 0528 5' 3.5 (1.613 m)     Head Circumference --      Peak Flow --      Pain Score 11/13/23 0528 10     Pain Loc --      Pain Education --      Exclude from Growth Chart --     Most recent vital signs: Vitals:   11/13/23 0547  BP: (!) 174/86  Pulse: 65  Resp: 16  Temp: 98.3 F (36.8 C)  SpO2: 98%    CONSTITUTIONAL: Alert, responds appropriately to questions.  Elderly, appears uncomfortable HEAD: Normocephalic, atraumatic EYES: Conjunctivae clear, pupils appear equal, sclera nonicteric ENT: normal nose; moist mucous membranes NECK: Supple, normal ROM CARD: RRR; S1 and S2 appreciated RESP: Normal chest excursion without splinting or tachypnea; breath sounds clear and equal bilaterally; no wheezes, no rhonchi, no rales, no hypoxia or respiratory distress, speaking full sentences ABD/GI: Non-distended; soft, tender  throughout the abdomen worse in the upper abdomen without guarding or rebound BACK: The back appears normal EXT: Normal ROM in all joints; no deformity noted, no edema SKIN: Normal color for age and race; warm; no rash on exposed skin NEURO: Moves all extremities equally, normal speech PSYCH: The patient's mood and manner are appropriate.   ED Results / Procedures / Treatments   LABS: (all labs ordered are listed, but only abnormal results are displayed) Labs Reviewed  CBC WITH DIFFERENTIAL/PLATELET - Abnormal; Notable for the following components:      Result Value   RBC 5.15 (*)    Hemoglobin 16.1 (*)    HCT 47.7 (*)    All other components within normal limits  COMPREHENSIVE METABOLIC PANEL WITH GFR - Abnormal; Notable for the following components:   Potassium 2.9 (*)     Glucose, Bld 133 (*)    All other components within normal limits  LIPASE, BLOOD  URINALYSIS, ROUTINE W REFLEX MICROSCOPIC  MAGNESIUM  TROPONIN I (HIGH SENSITIVITY)     EKG:  EKG Interpretation Date/Time:  Thursday November 13 2023 06:01:30 EDT Ventricular Rate:  81 PR Interval:  201 QRS Duration:  112 QT Interval:  363 QTC Calculation: 422 R Axis:   7  Text Interpretation: Sinus rhythm Borderline intraventricular conduction delay Low voltage, precordial leads Borderline T abnormalities, diffuse leads Confirmed by Verneda Golder [id number]) on 11/13/2023 6:04:04 AM         RADIOLOGY: My personal review and interpretation of imaging: CT pending.  I have personally reviewed all radiology reports.   No results found.   PROCEDURES:  Critical Care performed: No     .1-3 Lead EKG Interpretation  Performed by: Ahja Martello, Clover Dao, DO Authorized by: Anasofia Micallef, Clover Dao, DO     Interpretation: normal     ECG rate:  66   ECG rate assessment: normal     Rhythm: sinus rhythm     Ectopy: none     Conduction: normal       IMPRESSION / MDM / ASSESSMENT AND PLAN / ED COURSE  I reviewed the triage vital signs and the nursing notes.    Patient here with upper abdominal pain.  History of jejunal diverticulitis in 2022.  The patient is on the cardiac monitor to evaluate for evidence of arrhythmia and/or significant heart rate changes.   DIFFERENTIAL DIAGNOSIS (includes but not limited to):   Diverticulitis, gastritis, GERD, pancreatitis, colitis, bowel obstruction, appendicitis, UTI, less likely ACS   Patient's presentation is most consistent with acute presentation with potential threat to life or bodily function.   PLAN: Will obtain abdominal labs, EKG, CT of the abdomen pelvis.  Patient declines narcotic pain medication.  Will give dose of Toradol.  No prior history of kidney dysfunction.  Will also give IV fluids, Zofran .   MEDICATIONS GIVEN IN ED: Medications  potassium  chloride SA (KLOR-CON  M) CR tablet 40 mEq (has no administration in time range)  potassium chloride  10 mEq in 100 mL IVPB (has no administration in time range)  sodium chloride 0.9 % bolus 1,000 mL (1,000 mLs Intravenous New Bag/Given 11/13/23 0600)  ondansetron  (ZOFRAN ) injection 4 mg (4 mg Intravenous Given 11/13/23 0557)  ketorolac (TORADOL) 30 MG/ML injection 15 mg (15 mg Intravenous Given 11/13/23 0557)  iohexol  (OMNIPAQUE ) 300 MG/ML solution 100 mL (100 mLs Intravenous Contrast Given 11/13/23 0704)     ED COURSE: Labs show no leukocytosis.  Potassium of 2.9.  No EKG abnormalities.  Will check magnesium  level.  Will give IV and oral replacement.  Normal LFTs, lipase.  Negative troponin.  CT scan, urine pending.  Signed out the oncoming physician at 7 AM.   CONSULTS: Pending further workup.   OUTSIDE RECORDS REVIEWED: Reviewed podiatry notes in 2021.       FINAL CLINICAL IMPRESSION(S) / ED DIAGNOSES   Final diagnoses:  Upper abdominal pain  Hypokalemia     Rx / DC Orders   ED Discharge Orders     None        Note:  This document was prepared using Dragon voice recognition software and may include unintentional dictation errors.   Carnesha Maravilla, Clover Dao, DO 11/13/23 [phone]

## 2023-11-13 NOTE — ED Notes (Signed)
 Pt asking for some water at this time. After talking to RN this tech gave mouth sponge to pt to help with dry mouth.

## 2023-11-13 NOTE — ED Triage Notes (Signed)
 Pt presents to the ED via POV with complaints of lower abdominal pain that started last night. She believes it a diverticulitis flare. Rates the pain 10/10 and took a baby ASA PTA without improvement. A&Ox4 at this time. Denies CP or SOB.

## 2023-11-13 NOTE — H&P (Signed)
 History and Physical    Patient: Ann Harris ZOX:[id number] DOB: Oct 05, 1950 DOA: 11/13/2023 DOS: the patient was seen and examined on 11/13/2023 PCP: Rodolfo Clan Patient coming from: Home - lives alone; NOK: Roanna Chew, [phone]   Chief Complaint: Abdominal pain  HPI: Ann Harris is a 73 y.o. female with medical history significant of HTN, diverticulitis, and DM who presented on 6/19 with lower abdominal pain.  She reports that she awoke around 0200 with some abdominal pain.  The pain gradually got debilitating.  It reminded her of her first diagnosis with diverticulitis.  She was treated as an outpatient.  She has never had a colonoscopy and I don't want one.  She has also never had a Cologuard.    ER Course:  Contained diverticular perforation with diverticulitis.  Dr. Mauri Sous will evaluate but likely no urgent surgical needs.  On Zosyn.     Review of Systems: As mentioned in the history of present illness. All other systems reviewed and are negative. Past Medical History:  Diagnosis Date   CAD (coronary artery disease) 2001   MI, no intervention needed   Diabetes mellitus (HCC)    Diverticulitis    Hypertension    Past Surgical History:  Procedure Laterality Date   CHOLECYSTECTOMY     Social History:  reports that she has never smoked. She has never used smokeless tobacco. She reports current alcohol use. She reports that she does not use drugs.  Allergies  Allergen Reactions   Clopidogrel Other (See Comments)    bruising    Lisinopril Cough    headaches   Losartan Diarrhea and Other (See Comments)    Sensitivity to light     History reviewed. No pertinent family history.  Prior to Admission medications   Medication Sig Start Date End Date Taking? Authorizing Provider  aspirin 81 MG EC tablet Take by mouth.    [provider]  hydrochlorothiazide (HYDRODIURIL) 25 MG tablet Take by mouth. 02/24/20   [provider]  metFORMIN   (GLUCOPHAGE ) 500 MG tablet Take 1 tablet (500 mg total) by mouth 2 (two) times daily with a meal for 7 days, THEN 2 tablets (1,000 mg total) 2 (two) times daily with a meal for 23 days. You can increase to 1000 mg twice a day if you have no abdominal pain, diarrhea, or other side effects.. 06/13/20 07/13/20  Loman Risk, MD  metoprolol  tartrate (LOPRESSOR ) 50 MG tablet Take by mouth. 02/24/20   [provider]  ondansetron  (ZOFRAN  ODT) 4 MG disintegrating tablet Take 1 tablet (4 mg total) by mouth every 8 (eight) hours as needed for nausea or vomiting. 06/13/20   Loman Risk, MD    Physical Exam: Vitals:   11/13/23 0528 11/13/23 0547 11/13/23 0700  BP:  (!) 174/86 125/76  Pulse:  65 87  Resp:  16 20  Temp:  98.3 F (36.8 C)   TempSrc:  Oral   SpO2:  98% 93%  Weight: 75.8 kg    Height: 5' 3.5 (1.613 m)     General:  Appears calm and comfortable and is in NAD Eyes:  PERRL, EOMI, normal lids, iris ENT:  grossly normal hearing, lips & tongue, mmm; appropriate dentition Neck:  no LAD, masses or thyromegaly Cardiovascular:  RRR, no m/r/g. No LE edema.  Respiratory:   CTA bilaterally with no wheezes/rales/rhonchi.  Normal respiratory effort. Abdomen:  soft, NT, ND Skin:  no rash or induration seen on limited exam Musculoskeletal:  grossly normal tone BUE/BLE,  good ROM, no bony abnormality Psychiatric:  grossly normal mood and affect, speech fluent and appropriate, AOx3 Neurologic:  CN 2-12 grossly intact, moves all extremities in coordinated fashion, sensation intact   Radiological Exams on Admission: Independently reviewed - see discussion in A/P where applicable  CT ABDOMEN PELVIS W CONTRAST Result Date: 11/13/2023 CLINICAL DATA:  Evaluate for acute diverticulitis. EXAM: CT ABDOMEN AND PELVIS WITH CONTRAST TECHNIQUE: Multidetector CT imaging of the abdomen and pelvis was performed using the standard protocol following bolus administration of intravenous contrast. RADIATION  DOSE REDUCTION: This exam was performed according to the departmental dose-optimization program which includes automated exposure control, adjustment of the mA and/or kV according to patient size and/or use of iterative reconstruction technique. CONTRAST:  OMNIPAQUE  IOHEXOL  300 MG/ML  SOLN COMPARISON:  06/13/2020 FINDINGS: Lower chest: Dependent changes with ground-glass attenuation and subsegmental atelectasis noted in the lung bases. No pleural fluid or consolidative change. Hepatobiliary: No suspicious liver lesion. Cholecystectomy. The common bile duct measures 1.3 cm in diameter without significant intrahepatic bile duct dilatation. This is unchanged compared with the previous exam. Pancreas: Unremarkable. No pancreatic ductal dilatation or surrounding inflammatory changes. Spleen: Normal in size without focal abnormality. Adrenals/Urinary Tract: Adrenal glands are unremarkable. Kidneys are normal, without renal calculi, focal lesion, or hydronephrosis. Bladder is unremarkable. Stomach/Bowel: The stomach is nondistended. There is a large diverticulum arising off the descending duodenum measuring 5.2 cm, similar to previous exam. Scattered small bowel diverticula are again noted. Within the right paramidline abdomen there is focal inflammatory changes involving the mid small bowel, image 48/2. There is associated soft tissue stranding and edema extending into the small bowel mesentery. Multiple small bowel diverticula are identified within this region with epicenter of the inflammatory changes surrounding a diverticula, image 46/2. Small foci of extraluminal gas noted within the area of inflammation compatible with contained perforation. No signs of free perforation noted or abscess. Associated mild small bowel wall thickening is noted. No pathologic dilatation of the bowel loops to suggest an obstruction. The appendix is visualized and appears normal. Distal colonic diverticula noted without acute  diverticulitis. Vascular/Lymphatic: Aortic atherosclerosis. No aneurysm. Patent abdominal vascularity. No abdominopelvic adenopathy. Reproductive: Uterus and bilateral adnexa are unremarkable. Other: Small volume of free fluid noted within the abdomen and pelvis. No loculated fluid collections identified. No signs of pneumoperitoneum. Musculoskeletal: No acute or significant osseous findings. Lumbar degenerative disc disease is noted at L1-2 and L2-3. The IMPRESSION: 1. Examination is positive for acute diverticulitis involving the mid small bowel. Small foci of extraluminal gas noted within the area of inflammation which may reflect contained perforation. No signs of free perforation noted however. No abscess identified. 2. Small volume of free fluid noted within the abdomen and pelvis. 3. Stable appearance of large duodenal diverticulum. 4. Status post cholecystectomy. Stable dilatation of the common bile duct without significant intrahepatic bile duct dilatation. Findings likely reflect post cholecystectomy physiology. 5.  Aortic Atherosclerosis (ICD10-I70.0). Electronically Signed   By: Kimberley Penman M.D.   On: 11/13/2023 08:06    EKG: Independently reviewed.  NSR with rate 81; nonspecific ST changes with no evidence of acute ischemia   Labs on Admission: I have personally reviewed the available labs and imaging studies at the time of the admission.  Pertinent labs:    K+ 2.9 Glucose 133 HS troponin 6, 6 WBC 8.5 Hgb 16.1 UA: large LE, + nitrite, many bacteria   Assessment and Plan: Principal Problem:   Diverticulitis of small intestine with perforation without  abscess or bleeding Active Problems:   Hypertension   Diabetes mellitus (HCC)   CAD (coronary artery disease)   Hypokalemia    Diverticulitis with contained perforation Patient's symptoms are c/w diverticulitis and her CT supports this as a diagnosis No SIRS criteria Outpatient management would be reasonable if not for the  perforation but this will require ongoing inpatient management Bowel rest, IVF, pain medication with morphine, nausea medication with Zofran   Treat with Zosyn for intraabdominal infection Surgery is consulting, likely conservative management for now  Hypokalemia Repleted in ER Likely associated with HCTZ Will follow  CAD Continue ASA No report of CP at this time, appears compensated  HTN Continue hydrochlorothiazide, metoprolol   DM Last A1c was 5.8 in 04/2023, good control Hold Glucophage  Cover with moderate-scale SSI      Advance Care Planning:   Code Status: Full Code - Code status was discussed with the patient and/or family at the time of admission.  The patient would want to receive full resuscitative measures at this time.   Consults: Surgery  DVT Prophylaxis: SCDs  Family Communication: None present  Severity of Illness: The appropriate patient status for this patient is INPATIENT. Inpatient status is judged to be reasonable and necessary in order to provide the required intensity of service to ensure the patient's safety. The patient's presenting symptoms, physical exam findings, and initial radiographic and laboratory data in the context of their chronic comorbidities is felt to place them at high risk for further clinical deterioration. Furthermore, it is not anticipated that the patient will be medically stable for discharge from the hospital within 2 midnights of admission.   * I certify that at the point of admission it is my clinical judgment that the patient will require inpatient hospital care spanning beyond 2 midnights from the point of admission due to high intensity of service, high risk for further deterioration and high frequency of surveillance required.*  Author: Lorita Rosa, MD 11/13/2023 9:49 AM  For on call review [email or web address].

## 2023-11-13 NOTE — ED Notes (Signed)
Informed RN bed assigned 

## 2023-11-14 DIAGNOSIS — K5700 Diverticulitis of small intestine with perforation and abscess without bleeding: Secondary | ICD-10-CM | POA: Diagnosis not present

## 2023-11-14 LAB — CBC
HCT: 41.5 % (ref 36.0–46.0)
Hemoglobin: 13.9 g/dL (ref 12.0–15.0)
MCH: 30.9 pg (ref 26.0–34.0)
MCHC: 33.5 g/dL (ref 30.0–36.0)
MCV: 92.2 fL (ref 80.0–100.0)
Platelets: 169 10*3/uL (ref 150–400)
RBC: 4.5 MIL/uL (ref 3.87–5.11)
RDW: 12.9 % (ref 11.5–15.5)
WBC: 9.6 10*3/uL (ref 4.0–10.5)
nRBC: 0 % (ref 0.0–0.2)

## 2023-11-14 LAB — BASIC METABOLIC PANEL WITH GFR
Anion gap: 10 (ref 5–15)
BUN: 14 mg/dL (ref 8–23)
CO2: 25 mmol/L (ref 22–32)
Calcium: 8.9 mg/dL (ref 8.9–10.3)
Chloride: 108 mmol/L (ref 98–111)
Creatinine, Ser: 0.57 mg/dL (ref 0.44–1.00)
GFR, Estimated: >60 mL/min (ref >60)
Glucose, Bld: 128 mg/dL — ABNORMAL HIGH (ref 70–99)
Potassium: 3.5 mmol/L (ref 3.5–5.1)
Sodium: 143 mmol/L (ref 135–145)

## 2023-11-14 LAB — GLUCOSE, CAPILLARY
Glucose-Capillary: 121 mg/dL — ABNORMAL HIGH (ref 70–99)
Glucose-Capillary: 173 mg/dL — ABNORMAL HIGH (ref 70–99)

## 2023-11-14 LAB — MAGNESIUM: Magnesium: 2 mg/dL (ref 1.7–2.4)

## 2023-11-14 NOTE — Plan of Care (Signed)

## 2023-11-14 NOTE — Progress Notes (Signed)
 [facility] SURGICAL PROGRESS NOTE (cpt [id number])  Hospital Day(s): 1.   Interval History: Patient seen and examined, no acute events or new complaints overnight. Patient reports she is feeling better this AM slept well. Abdominal pian markedly improved; 1/10. She denied fever, chills, nausea, emesis. She remains without leukocytosis; WBC 9.6K. Hgb to 13.9. Renal function normal; sCr - 0.57; UO - unmeasured. No electrolyte derangements. She has been NPO overnight.   Review of Systems:  Constitutional: denies fever, chills  HEENT: denies cough or congestion  Respiratory: denies any shortness of breath  Cardiovascular: denies chest pain or palpitations  Gastrointestinal: denies abdominal pain, N/V Genitourinary: denies burning with urination or urinary frequency Musculoskeletal: denies pain, decreased motor or sensation   Vital signs in last 24 hours: [min-max] current  Temp:  [97.7 F (36.5 C)-99.1 F (37.3 C)] 98.5 F (36.9 C) (06/20 0402) Pulse Rate:  [70-95] 93 (06/20 0402) Resp:  [18-24] 20 (06/20 0402) BP: (126-154)/(61-76) 126/75 (06/20 0402) SpO2:  [90 %-92 %] 92 % (06/20 0402)     Height: 5' 3.5 (161.3 cm) Weight: 75.8 kg BMI (Calculated): 29.12   Intake/Output last 2 shifts:  No intake/output data recorded.   Physical Exam:  Constitutional: alert, cooperative and no distress  HENT: normocephalic without obvious abnormality  Eyes: PERRL, EOM's grossly intact and symmetric  Respiratory: breathing non-labored at rest  Cardiovascular: regular rate and sinus rhythm  Gastrointestinal: soft, improved, minimal, central abdominal soreness, and non-distended.. No rebound/guarding. She is not peritonitic Musculoskeletal: no edema or wounds, motor and sensation grossly intact, NT    Labs:     Latest Ref Rng & Units 11/14/2023    4:37 AM 11/13/2023    5:52 AM 06/13/2020    5:30 PM  CBC  WBC 4.0 - 10.5 K/uL 9.6  8.5  5.9   Hemoglobin 12.0 - 15.0 g/dL 60.4  54.0   98.1   Hematocrit 36.0 - 46.0 % 41.5  47.7  44.2   Platelets 150 - 400 K/uL 169  188  197       Latest Ref Rng & Units 11/14/2023    4:37 AM 11/13/2023    5:52 AM 06/13/2020    5:30 PM  CMP  Glucose 70 - 99 mg/dL 191  478  295   BUN 8 - 23 mg/dL 14  12  11    Creatinine 0.44 - 1.00 mg/dL 6.21  3.08  6.57   Sodium 135 - 145 mmol/L 143  141  135   Potassium 3.5 - 5.1 mmol/L 3.5  2.9  2.9   Chloride 98 - 111 mmol/L 108  101  95   CO2 22 - 32 mmol/L 25  30  29    Calcium 8.9 - 10.3 mg/dL 8.9  9.9  9.2   Total Protein 6.5 - 8.1 g/dL  7.3  7.6   Total Bilirubin 0.0 - 1.2 mg/dL  0.8  0.9   Alkaline Phos 38 - 126 U/L  43  109   AST 15 - 41 U/L  21  28   ALT 0 - 44 U/L  22  24      Imaging studies: No new pertinent imaging studies   Assessment/Plan:  73 y.o. female with small bowel diverticulitis with small foci of extraluminal air without peritonitis   - Her examination, hemodynamics, and laboratory work up are all reassuring. I think it is reasonable for now to manage this conservatively. She understands that if she fails to improve or deteriorates in  any fashion, we will need to consider emergent surgical intervention.               - We can initiate CLD today; take it slow. Hopefully advance this PM vs tomorrow morning - IV Abx (Zosyn) - IVF support             - Monitor abdominal examination; on-going bowel function             - Pain control prn; antiemetics prn              - Mobilize as tolerated              - Further management per primary service; we will follow along    - Discharge Planning: Doing well with improvement in symptoms. Will start advancing diet today. She will benefit from at least another 24 hours in house. Hopefully home tomorrow (06/21).   All of the above findings and recommendations were discussed with the patient, and the medical team, and all of patient's questions were answered to herr expressed satisfaction.   -- Apolonio Bay, PA-C [facility] 11/14/2023, 7:38 AM M-F: 7am - 4pm

## 2023-11-14 NOTE — Progress Notes (Signed)
 PROGRESS NOTE  Ann Harris    DOB: [birth year], 73 y.o.  AOZ:[id number]    Code Status: Full Code   DOA: 11/13/2023   LOS: 1   Brief hospital course  Ann Harris is a 72 y.o. female with a PMH significant for HTN, diverticulitis, and DM who presented on 6/19 with lower abdominal pain. CT abdomen showing Contained diverticular perforation with diverticulitis.  General surgery following with conservative management, zosyn.   Patient was admitted to medicine service for further workup and management of diverticulitis with complication as outlined in detail below.  11/14/23 -improved clinically  Assessment & Plan  Principal Problem:   Diverticulitis of small intestine with perforation without abscess or bleeding Active Problems:   Hypertension   Diabetes mellitus (HCC)   CAD (coronary artery disease)   Hypokalemia  Diverticulitis with contained perforation- improving with conservative management.She had significant indent/constriction in her abdomen from the waistband of her underwear right where her pain was worse felt and improved when removed so I asked her to keep down to prevent bowel constriction.   - continue Bowel rest, IVF - analgesia, antiemetics PRN - advance diet per gen surg Treat with Zosyn for intraabdominal infection Surgery is consulting, likely conservative management for now   Hypokalemia- resolved s/p replacement. Likely associated with HCTZ - BMP am.    CAD Continue ASA   HTN Continue hydrochlorothiazide, metoprolol    DM- Last A1c was 5.8 in 04/2023, good control Hold Glucophage   Body mass index is 29.12 kg/m.  VTE ppx: SCDs Start: 11/13/23 0948  Diet:     Diet   Diet NPO time specified Except for: Sips with Meds   Consultants: General surgery   Subjective 11/14/23    Ann Harris reports doing better today. Has not vomited today and drank fluids this morning. Discomfort is central abdominal area and improved from presentation. No BM or gas.     Objective  Blood pressure 126/75, pulse 93, temperature 98.5 F (36.9 C), resp. rate 20, height 5' 3.5 (1.613 m), weight 75.8 kg, SpO2 92%. No intake or output data in the 24 hours ending 11/14/23 0748 Filed Weights   11/13/23 0528  Weight: 75.8 kg     Physical Exam:  General: awake, alert, NAD HEENT: atraumatic, clear conjunctiva, anicteric sclera, MMM, hearing grossly normal Respiratory: normal respiratory effort. Cardiovascular: quick capillary refill, normal S1/S2, RRR, no JVD, murmurs Gastrointestinal: soft,ND. Endorses pain to palpation diffusely without guarding or rebound. Overall benign exam. She had significant indent/constriction in her abdomen from the waistband of her underwear which I asked her to keep down to prevent bowel constriction.  Nervous: A&O x3. no gross focal neurologic deficits, normal speech Extremities: moves all equally, no edema, normal tone Skin: dry, intact, normal temperature, normal color. No rashes, lesions or ulcers on exposed skin Psychiatry: normal mood, congruent affect  Labs   I have personally reviewed the following labs and imaging studies CBC    Component Value Date/Time   WBC 9.6 11/14/2023 0437   RBC 4.50 11/14/2023 0437   HGB 13.9 11/14/2023 0437   HGB 15.8 01/06/2013 0837   HCT 41.5 11/14/2023 0437   HCT 45.1 01/06/2013 0837   PLT 169 11/14/2023 0437   PLT 168 01/06/2013 0837   MCV 92.2 11/14/2023 0437   MCV 90 01/06/2013 0837   MCH 30.9 11/14/2023 0437   MCHC 33.5 11/14/2023 0437   RDW 12.9 11/14/2023 0437   RDW 13.1 01/06/2013 0837   LYMPHSABS 1.7 11/13/2023 0552  MONOABS 0.3 11/13/2023 0552   EOSABS 0.1 11/13/2023 0552   BASOSABS 0.0 11/13/2023 0552      Latest Ref Rng & Units 11/14/2023    4:37 AM 11/13/2023    5:52 AM 06/13/2020    5:30 PM  BMP  Glucose 70 - 99 mg/dL 161  096  045   BUN 8 - 23 mg/dL 14  12  11    Creatinine 0.44 - 1.00 mg/dL 4.09  8.11  9.14   Sodium 135 - 145 mmol/L 143  141  135   Potassium  3.5 - 5.1 mmol/L 3.5  2.9  2.9   Chloride 98 - 111 mmol/L 108  101  95   CO2 22 - 32 mmol/L 25  30  29    Calcium 8.9 - 10.3 mg/dL 8.9  9.9  9.2     CT ABDOMEN PELVIS W CONTRAST Result Date: 11/13/2023 CLINICAL DATA:  Evaluate for acute diverticulitis. EXAM: CT ABDOMEN AND PELVIS WITH CONTRAST TECHNIQUE: Multidetector CT imaging of the abdomen and pelvis was performed using the standard protocol following bolus administration of intravenous contrast. RADIATION DOSE REDUCTION: This exam was performed according to the departmental dose-optimization program which includes automated exposure control, adjustment of the mA and/or kV according to patient size and/or use of iterative reconstruction technique. CONTRAST:  OMNIPAQUE  IOHEXOL  300 MG/ML  SOLN COMPARISON:  06/13/2020 FINDINGS: Lower chest: Dependent changes with ground-glass attenuation and subsegmental atelectasis noted in the lung bases. No pleural fluid or consolidative change. Hepatobiliary: No suspicious liver lesion. Cholecystectomy. The common bile duct measures 1.3 cm in diameter without significant intrahepatic bile duct dilatation. This is unchanged compared with the previous exam. Pancreas: Unremarkable. No pancreatic ductal dilatation or surrounding inflammatory changes. Spleen: Normal in size without focal abnormality. Adrenals/Urinary Tract: Adrenal glands are unremarkable. Kidneys are normal, without renal calculi, focal lesion, or hydronephrosis. Bladder is unremarkable. Stomach/Bowel: The stomach is nondistended. There is a large diverticulum arising off the descending duodenum measuring 5.2 cm, similar to previous exam. Scattered small bowel diverticula are again noted. Within the right paramidline abdomen there is focal inflammatory changes involving the mid small bowel, image 48/2. There is associated soft tissue stranding and edema extending into the small bowel mesentery. Multiple small bowel diverticula are identified within this  region with epicenter of the inflammatory changes surrounding a diverticula, image 46/2. Small foci of extraluminal gas noted within the area of inflammation compatible with contained perforation. No signs of free perforation noted or abscess. Associated mild small bowel wall thickening is noted. No pathologic dilatation of the bowel loops to suggest an obstruction. The appendix is visualized and appears normal. Distal colonic diverticula noted without acute diverticulitis. Vascular/Lymphatic: Aortic atherosclerosis. No aneurysm. Patent abdominal vascularity. No abdominopelvic adenopathy. Reproductive: Uterus and bilateral adnexa are unremarkable. Other: Small volume of free fluid noted within the abdomen and pelvis. No loculated fluid collections identified. No signs of pneumoperitoneum. Musculoskeletal: No acute or significant osseous findings. Lumbar degenerative disc disease is noted at L1-2 and L2-3. The IMPRESSION: 1. Examination is positive for acute diverticulitis involving the mid small bowel. Small foci of extraluminal gas noted within the area of inflammation which may reflect contained perforation. No signs of free perforation noted however. No abscess identified. 2. Small volume of free fluid noted within the abdomen and pelvis. 3. Stable appearance of large duodenal diverticulum. 4. Status post cholecystectomy. Stable dilatation of the common bile duct without significant intrahepatic bile duct dilatation. Findings likely reflect post cholecystectomy physiology. 5.  Aortic  Atherosclerosis (ICD10-I70.0). Electronically Signed   By: Kimberley Penman M.D.   On: 11/13/2023 08:06    Disposition Plan & Communication  Patient status: Inpatient  Admitted From: Home Planned disposition location: Home Anticipated discharge date: 6/21 pending clinical improvement   Family Communication: none     Author: Ree Candy, DO [facility] 11/14/2023, 7:48 AM   Available by Epic secure chat  7AM-7PM. If 7PM-7AM, please contact night-coverage.  [facility] contact information found on [email or web address].

## 2023-11-15 DIAGNOSIS — J9601 Acute respiratory failure with hypoxia: Secondary | ICD-10-CM

## 2023-11-15 DIAGNOSIS — K5700 Diverticulitis of small intestine with perforation and abscess without bleeding: Secondary | ICD-10-CM | POA: Diagnosis not present

## 2023-11-15 DIAGNOSIS — R1010 Upper abdominal pain, unspecified: Principal | ICD-10-CM

## 2023-11-15 LAB — CBC
HCT: 38.3 % (ref 36.0–46.0)
Hemoglobin: 13 g/dL (ref 12.0–15.0)
MCH: 31.6 pg (ref 26.0–34.0)
MCHC: 33.9 g/dL (ref 30.0–36.0)
MCV: 93.2 fL (ref 80.0–100.0)
Platelets: 156 10*3/uL (ref 150–400)
RBC: 4.11 MIL/uL (ref 3.87–5.11)
RDW: 13 % (ref 11.5–15.5)
WBC: 9.4 10*3/uL (ref 4.0–10.5)
nRBC: 0 % (ref 0.0–0.2)

## 2023-11-15 MED ORDER — LACTULOSE 10 GM/15ML PO SOLN
30.0000 g | Freq: Every day | ORAL | Status: DC
  Administered 2023-11-15: 30 g via ORAL
  Filled 2023-11-15: qty 60

## 2023-11-15 MED ORDER — POLYETHYLENE GLYCOL 3350 17 G PO PACK
17.0000 g | PACK | Freq: Two times a day (BID) | ORAL | Status: DC
  Administered 2023-11-15: 17 g via ORAL
  Filled 2023-11-15 (×3): qty 1

## 2023-11-15 NOTE — TOC CM/SW Note (Signed)
 Following for possible home o2 needs. Will reeval tomorrow for need per MD.  Keaghan Bowens, LCSW Transitions of Care Department [phone]

## 2023-11-15 NOTE — Care Management Important Message (Signed)
 Important Message  Patient Details  Name: BRITANIA SHREEVE MRN: [id number] Date of Birth: 1950-08-23   Important Message Given:  Yes - Medicare IM     Rojelio SHAUNNA Rattler 11/15/2023, 1:01 PM

## 2023-11-15 NOTE — Progress Notes (Signed)
 SATURATION QUALIFICATIONS: (This note is used to comply with regulatory documentation for home oxygen)  Patient Saturations on Room Air at Rest = 92%  Patient Saturations on Room Air while Ambulating = 86%  Patient Saturations recovered spontaneously while walking on Room Air when she stopped talking.  Ann Harris [name]

## 2023-11-15 NOTE — Plan of Care (Signed)

## 2023-11-15 NOTE — Progress Notes (Signed)
 [facility] SURGICAL PROGRESS NOTE (cpt [phone])  Hospital Day(s): 2.   Interval History: Patient seen and examined, no acute events or new complaints overnight. Patient reports she is feeling better this AM slept well.  She says she has no abdominal pain.  She is passing gas.  She does not feel nauseated.  She is tolerating a full liquid diet. Review of Systems:  Constitutional: denies fever, chills  HEENT: denies cough or congestion  Respiratory: denies any shortness of breath  Cardiovascular: denies chest pain or palpitations  Gastrointestinal: denies abdominal pain, N/V Genitourinary: denies burning with urination or urinary frequency Musculoskeletal: denies pain, decreased motor or sensation   Vital signs in last 24 hours: [min-max] current  Temp:  [99.2 F (37.3 C)-99.9 F (37.7 C)] 99.2 F (37.3 C) (06/21 0731) Pulse Rate:  [81-98] 81 (06/21 0731) Resp:  [16-18] 16 (06/21 0731) BP: (131-150)/(73-76) 131/73 (06/21 0731) SpO2:  [86 %-92 %] 92 % (06/21 0731)     Height: 5' 3.5 (161.3 cm) Weight: 75.8 kg BMI (Calculated): 29.12   Intake/Output last 2 shifts:  06/20 0701 - 06/21 0700 In: 3073.9 [P.O.:780; I.V.:2137.8; IV Piggyback:156.1] Out: -    Physical Exam:  Constitutional: alert, cooperative and no distress  HENT: normocephalic without obvious abnormality  Eyes: PERRL, EOM's grossly intact and symmetric  Respiratory: breathing non-labored at rest  Cardiovascular: regular rate and sinus rhythm  Gastrointestinal: soft, no pain on palpation. Musculoskeletal: no edema or wounds, motor and sensation grossly intact, NT    Labs:     Latest Ref Rng & Units 11/15/2023    4:52 AM 11/14/2023    4:37 AM 11/13/2023    5:52 AM  CBC  WBC 4.0 - 10.5 K/uL 9.4  9.6  8.5   Hemoglobin 12.0 - 15.0 g/dL 86.9  86.0  83.8   Hematocrit 36.0 - 46.0 % 38.3  41.5  47.7   Platelets 150 - 400 K/uL 156  169  188       Latest Ref Rng & Units 11/14/2023    4:37 AM 11/13/2023     5:52 AM 06/13/2020    5:30 PM  CMP  Glucose 70 - 99 mg/dL 871  866  708   BUN 8 - 23 mg/dL 14  12  11    Creatinine 0.44 - 1.00 mg/dL 9.42  9.36  9.35   Sodium 135 - 145 mmol/L 143  141  135   Potassium 3.5 - 5.1 mmol/L 3.5  2.9  2.9   Chloride 98 - 111 mmol/L 108  101  95   CO2 22 - 32 mmol/L 25  30  29    Calcium 8.9 - 10.3 mg/dL 8.9  9.9  9.2   Total Protein 6.5 - 8.1 g/dL  7.3  7.6   Total Bilirubin 0.0 - 1.2 mg/dL  0.8  0.9   Alkaline Phos 38 - 126 U/L  43  109   AST 15 - 41 U/L  21  28   ALT 0 - 44 U/L  22  24      Imaging studies: No new pertinent imaging studies   Assessment/Plan:  73 y.o. female with small bowel diverticulitis with small foci of extraluminal air without peritonitis   - Patient doing well and tolerating a full liquid diet.  Okay to advance her to her a soft diet.  Recommended total of 7 days of antibiotics.  Otherwise rest of management per primary team.  Appropriate for discharge from a surgical standpoint.  Surgery team to sign off.  She does not need follow-up in the office.  A total of 35 minutes was spent reviewing the patient's chart, performing interval history and physical and discussing treatment options with the patient and the primary team  -- Jayson MALVA Endow

## 2023-11-15 NOTE — Progress Notes (Signed)
 PROGRESS NOTE  Ann Harris    DOB: 1950-07-05, 73 y.o.  FMW:[id number]    Code Status: Full Code   DOA: 11/13/2023   LOS: 2   Brief hospital course  Ann Harris is a 73 y.o. female with a PMH significant for HTN, diverticulitis, and DM who presented on 6/19 with lower abdominal pain. CT abdomen showing Contained diverticular perforation with diverticulitis.  General surgery following with conservative management, zosyn .   Patient was admitted to medicine service for further workup and management of diverticulitis with complication as outlined in detail below.  11/15/23 -improved clinically. Still no BM. Endorses passing gas. Had O2 desats overnight and with ambulation today.   Assessment & Plan  Principal Problem:   Diverticulitis of small intestine with perforation without abscess or bleeding Active Problems:   Hypertension   Diabetes mellitus (HCC)   CAD (coronary artery disease)   Hypokalemia  Diverticulitis with contained perforation- improving with conservative management. She had significant indent/constriction in her abdomen from the waistband of her underwear right where her pain was worse felt and improved when removed so I asked her to keep down to prevent bowel constriction.   - advance diet as tolerated.  - analgesia, antiemetics PRN - general surgery signed off Treat with Zosyn  for intraabdominal infection - bowel regimen started  Hypoxia- atelectasis noted on CT without consolidation. Patient has no respiratory symptoms. Desat to mid 80s overnight and with ambulation today and rebounded with rest. Not high risk of emphysema from history - instructed her on use of IS - ambulate again in am to see if requires home O2   Hypokalemia- resolved s/p replacement. Likely associated with HCTZ - BMP am.    CAD Continue ASA   HTN Continue hydrochlorothiazide , metoprolol    DM- Last A1c was 5.8 in 04/2023, good control Hold Glucophage   Body mass index is 29.12  kg/m.  VTE ppx: SCDs Start: 11/13/23 0948  Diet:     Diet   Diet full liquid Room service appropriate? Yes; Fluid consistency: Thin   Consultants: General surgery   Subjective 11/15/23    Pt reports doing better today. Tolerated full liquids. Passing gas. No BM. Denies any cough, chest pain, SOB, DOE. No LE swelling.  Instructed her on IS use and she appeared to have good lung capacity   Objective  Blood pressure 126/75, pulse 93, temperature 98.5 F (36.9 C), resp. rate 20, height 5' 3.5 (1.613 m), weight 75.8 kg, SpO2 92%.  Intake/Output Summary (Last 24 hours) at 11/15/2023 0732 Last data filed at 11/14/2023 1900 Gross per 24 hour  Intake 3073.91 ml  Output --  Net 3073.91 ml   Filed Weights   11/13/23 0528  Weight: 75.8 kg     Physical Exam:  General: awake, alert, NAD HEENT: atraumatic, clear conjunctiva, anicteric sclera, MMM, hearing grossly normal Respiratory: normal respiratory effort. CTAB.  Cardiovascular: quick capillary refill, normal S1/S2, RRR, no JVD, murmurs Gastrointestinal: soft,ND, NT. Overall benign exam. Nervous: A&O x3. no gross focal neurologic deficits, normal speech Extremities: moves all equally, no edema, normal tone Skin: dry, intact, normal temperature, normal color. No rashes, lesions or ulcers on exposed skin Psychiatry: normal mood, congruent affect  Labs   I have personally reviewed the following labs and imaging studies CBC    Component Value Date/Time   WBC 9.4 11/15/2023 0452   RBC 4.11 11/15/2023 0452   HGB 13.0 11/15/2023 0452   HGB 15.8 01/06/2013 0837   HCT 38.3 11/15/2023 0452  HCT 45.1 01/06/2013 0837   PLT 156 11/15/2023 0452   PLT 168 01/06/2013 0837   MCV 93.2 11/15/2023 0452   MCV 90 01/06/2013 0837   MCH 31.6 11/15/2023 0452   MCHC 33.9 11/15/2023 0452   RDW 13.0 11/15/2023 0452   RDW 13.1 01/06/2013 0837   LYMPHSABS 1.7 11/13/2023 0552   MONOABS 0.3 11/13/2023 0552   EOSABS 0.1 11/13/2023 0552    BASOSABS 0.0 11/13/2023 0552      Latest Ref Rng & Units 11/14/2023    4:37 AM 11/13/2023    5:52 AM 06/13/2020    5:30 PM  BMP  Glucose 70 - 99 mg/dL 871  866  708   BUN 8 - 23 mg/dL 14  12  11    Creatinine 0.44 - 1.00 mg/dL 9.42  9.36  9.35   Sodium 135 - 145 mmol/L 143  141  135   Potassium 3.5 - 5.1 mmol/L 3.5  2.9  2.9   Chloride 98 - 111 mmol/L 108  101  95   CO2 22 - 32 mmol/L 25  30  29    Calcium 8.9 - 10.3 mg/dL 8.9  9.9  9.2     No results found.   Disposition Plan & Communication  Patient status: Inpatient  Admitted From: Home Planned disposition location: Home Anticipated discharge date: 6/22 pending clinical improvement   Family Communication: none     Author: Marien LITTIE Piety, DO [facility] 11/15/2023, 7:32 AM   Available by Epic secure chat 7AM-7PM. If 7PM-7AM, please contact night-coverage.  [facility] contact information found on [email or web address].

## 2023-11-16 DIAGNOSIS — K5700 Diverticulitis of small intestine with perforation and abscess without bleeding: Secondary | ICD-10-CM | POA: Diagnosis not present

## 2023-11-16 DIAGNOSIS — E876 Hypokalemia: Secondary | ICD-10-CM | POA: Diagnosis not present

## 2023-11-16 MED ORDER — METRONIDAZOLE 500 MG PO TABS
500.0000 mg | ORAL_TABLET | Freq: Two times a day (BID) | ORAL | 0 refills | Status: AC
Start: 2023-11-16 — End: 2023-11-20

## 2023-11-16 MED ORDER — LEVOFLOXACIN 500 MG PO TABS: 500.0000 mg | ORAL_TABLET | Freq: Every day | ORAL | 0 refills | Status: AC

## 2023-11-16 MED ORDER — POLYETHYLENE GLYCOL 3350 17 G PO PACK
17.0000 g | PACK | Freq: Two times a day (BID) | ORAL | 0 refills | Status: AC
Start: 2023-11-16 — End: 2023-12-16

## 2023-11-16 NOTE — Discharge Instructions (Signed)
 Please take your antibiotics until they are complete. Of note- you may experience diarrhea as a side effect of the medications.  Keep your follow up appointment with your PCP to discuss your recovery and preventative plan

## 2023-11-16 NOTE — Progress Notes (Incomplete)
 PROGRESS NOTE  Ann Harris    DOB: March 13, 1951, 73 y.o.  FMW:[id number]    Code Status: Full Code   DOA: 11/13/2023   LOS: 3   Brief hospital course  Ann Harris is a 73 y.o. female with a PMH significant for HTN, diverticulitis, and DM who presented on 6/19 with lower abdominal pain. CT abdomen showing Contained diverticular perforation with diverticulitis.  General surgery following with conservative management, zosyn .   Patient was admitted to medicine service for further workup and management of diverticulitis with complication as outlined in detail below.  11/16/23 -improved clinically. Still no BM. Endorses passing gas. Had O2 desats overnight and with ambulation today.   Assessment & Plan  Principal Problem:   Diverticulitis of small intestine with perforation without abscess or bleeding Active Problems:   Hypertension   Diabetes mellitus (HCC)   CAD (coronary artery disease)   Hypokalemia   Upper abdominal pain  Diverticulitis with contained perforation- improving with conservative management. She had significant indent/constriction in her abdomen from the waistband of her underwear right where her pain was worse felt and improved when removed so I asked her to keep down to prevent bowel constriction.   - advance diet as tolerated.  - analgesia, antiemetics PRN - general surgery signed off Treat with Zosyn  for intraabdominal infection - bowel regimen started  Hypoxia- atelectasis noted on CT without consolidation. Patient has no respiratory symptoms. Desat to mid 80s overnight and with ambulation today and rebounded with rest. Not high risk of emphysema from history - instructed her on use of IS - ambulate again in am to see if requires home O2   Hypokalemia- resolved s/p replacement. Likely associated with HCTZ - BMP am.    CAD Continue ASA   HTN Continue hydrochlorothiazide , metoprolol    DM- Last A1c was 5.8 in 04/2023, good control Hold  Glucophage   Body mass index is 29.12 kg/m.  VTE ppx: SCDs Start: 11/13/23 0948  Diet:     Diet   DIET SOFT Room service appropriate? Yes; Fluid consistency: Thin   Consultants: General surgery   Subjective 11/16/23    Pt reports doing better today. Tolerated full liquids. Passing gas. No BM. Denies any cough, chest pain, SOB, DOE. No LE swelling.  Instructed her on IS use and she appeared to have good lung capacity   Objective  Blood pressure 126/75, pulse 93, temperature 98.5 F (36.9 C), resp. rate 20, height 5' 3.5 (1.613 m), weight 75.8 kg, SpO2 92%.  Intake/Output Summary (Last 24 hours) at 11/16/2023 0748 Last data filed at 11/15/2023 2112 Gross per 24 hour  Intake 300 ml  Output --  Net 300 ml   Filed Weights   11/13/23 0528  Weight: 75.8 kg     Physical Exam:  General: awake, alert, NAD HEENT: atraumatic, clear conjunctiva, anicteric sclera, MMM, hearing grossly normal Respiratory: normal respiratory effort. CTAB.  Cardiovascular: quick capillary refill, normal S1/S2, RRR, no JVD, murmurs Gastrointestinal: soft,ND, NT. Overall benign exam. Nervous: A&O x3. no gross focal neurologic deficits, normal speech Extremities: moves all equally, no edema, normal tone Skin: dry, intact, normal temperature, normal color. No rashes, lesions or ulcers on exposed skin Psychiatry: normal mood, congruent affect  Labs   I have personally reviewed the following labs and imaging studies CBC    Component Value Date/Time   WBC 9.4 11/15/2023 0452   RBC 4.11 11/15/2023 0452   HGB 13.0 11/15/2023 0452   HGB 15.8 01/06/2013 0837   HCT  38.3 11/15/2023 0452   HCT 45.1 01/06/2013 0837   PLT 156 11/15/2023 0452   PLT 168 01/06/2013 0837   MCV 93.2 11/15/2023 0452   MCV 90 01/06/2013 0837   MCH 31.6 11/15/2023 0452   MCHC 33.9 11/15/2023 0452   RDW 13.0 11/15/2023 0452   RDW 13.1 01/06/2013 0837   LYMPHSABS 1.7 11/13/2023 0552   MONOABS 0.3 11/13/2023 0552   EOSABS 0.1  11/13/2023 0552   BASOSABS 0.0 11/13/2023 0552      Latest Ref Rng & Units 11/14/2023    4:37 AM 11/13/2023    5:52 AM 06/13/2020    5:30 PM  BMP  Glucose 70 - 99 mg/dL 871  866  708   BUN 8 - 23 mg/dL 14  12  11    Creatinine 0.44 - 1.00 mg/dL 9.42  9.36  9.35   Sodium 135 - 145 mmol/L 143  141  135   Potassium 3.5 - 5.1 mmol/L 3.5  2.9  2.9   Chloride 98 - 111 mmol/L 108  101  95   CO2 22 - 32 mmol/L 25  30  29    Calcium 8.9 - 10.3 mg/dL 8.9  9.9  9.2     No results found.   Disposition Plan & Communication  Patient status: Inpatient  Admitted From: Home Planned disposition location: Home Anticipated discharge date: 6/22 pending clinical improvement   Family Communication: none     Author: Marien LITTIE Piety, DO [facility] 11/16/2023, 7:48 AM   Available by Epic secure chat 7AM-7PM. If 7PM-7AM, please contact night-coverage.  [facility] contact information found on [email or web address].

## 2023-11-16 NOTE — Discharge Summary (Signed)
 Physician Discharge Summary  Patient: Ann Harris FMW:[id number] DOB: March 03, 1951   Code Status: Full Code Admit date: 11/13/2023 Discharge date: 11/16/2023 Disposition: Home, No home health services recommended PCP: Fernande Ophelia JINNY DOUGLAS, MD  Recommendations for Outpatient Follow-up:  Follow up with PCP within 1-2 weeks Regarding general hospital follow up and preventative care Recommend evaluating recovery and discuss prevention of reoccurrence   Discharge Diagnoses:  Principal Problem:   Diverticulitis of small intestine with perforation without abscess or bleeding Active Problems:   Hypertension   Diabetes mellitus (HCC)   CAD (coronary artery disease)   Hypokalemia   Upper abdominal pain  Brief Hospital Course Summary: Ann Harris is a 73 y.o. female with a PMH significant for HTN, diverticulitis, and DM who presented on 6/19 with lower abdominal pain. CT abdomen showing Contained diverticular perforation with diverticulitis.    Diverticulitis with contained perforation- improving with conservative management. Did not use NG tube. She had significant indent/constriction in her abdomen from the waistband of her underwear right where her pain was worse felt and improved when removed so I asked her to keep down to prevent bowel constriction.   - diet advanced and tolerated well. Given bowel regimen and had BM prior to dc. No abdominal pain noted. Treated with IV zosyn  and transitioned to PO abx as listed below to complete treatment post-dc.    Hypoxia- atelectasis noted on CT without consolidation. Patient has no respiratory symptoms. Desat to mid 80s overnight and with ambulation which rebounded with rest. - instructed her on use of IS which she utilized diligintly  - ambulated again on day of discharge and had no desaturation. No need for O2 going home.  - recommend ongoing stool softener.    Hypokalemia- resolved s/p replacement. Likely associated with HCTZ -  hydrochlorothiazide  was held and BP remained WNL and potassium remained stable after replacement.   All other chronic conditions were treated with home medications.    Discharge Condition: Good, improved Recommended discharge diet: Regular healthy diet  Consultations: General surgery  Procedures/Studies: None   Allergies as of 11/16/2023       Reactions   Clopidogrel Other (See Comments)   bruising    Fenofibrate Other (See Comments)   Lisinopril Cough   headaches   Losartan Diarrhea, Other (See Comments)   Sensitivity to light         Medication List     TAKE these medications    aspirin  EC 81 MG tablet Take by mouth.   hydrochlorothiazide  25 MG tablet Commonly known as: HYDRODIURIL  Take by mouth.   levofloxacin 500 MG tablet Commonly known as: LEVAQUIN Take 1 tablet (500 mg total) by mouth daily for 4 days.   metFORMIN  500 MG tablet Commonly known as: GLUCOPHAGE  Take 500 mg by mouth 2 (two) times daily with a meal.   metoprolol  tartrate 50 MG tablet Commonly known as: LOPRESSOR  Take 1 tablet by mouth 2 (two) times daily.   metroNIDAZOLE  500 MG tablet Commonly known as: FLAGYL  Take 1 tablet (500 mg total) by mouth 2 (two) times daily for 4 days.   polyethylene glycol 17 g packet Commonly known as: MIRALAX  / GLYCOLAX  Take 17 g by mouth 2 (two) times daily.        Follow-up Information     Fernande Ophelia JINNY III, MD. Go in 1 week(s).   Specialty: Internal Medicine Contact information: [address] [facility] [place] [address] [phone]  Subjective   Pt reports feeling well. Denies SOB with ambulation. Had large BM this am.   All questions and concerns were addressed at time of discharge.  Objective  Blood pressure (!) 141/78, pulse 78, temperature 98.7 F (37.1 C), temperature source Oral, resp. rate 17, height 5' 3.5 (1.613 m), weight 75.8 kg, SpO2 96%.   General: Pt is alert, awake, not in  acute distress Cardiovascular: RRR, S1/S2 +, no rubs, no gallops Respiratory: CTA bilaterally, no wheezing, no rhonchi Abdominal: Soft, NT, ND, bowel sounds + Extremities: no edema, no cyanosis  The results of significant diagnostics from this hospitalization (including imaging, microbiology, ancillary and laboratory) are listed below for reference.   Imaging studies: CT ABDOMEN PELVIS W CONTRAST Result Date: 11/13/2023 CLINICAL DATA:  Evaluate for acute diverticulitis. EXAM: CT ABDOMEN AND PELVIS WITH CONTRAST TECHNIQUE: Multidetector CT imaging of the abdomen and pelvis was performed using the standard protocol following bolus administration of intravenous contrast. RADIATION DOSE REDUCTION: This exam was performed according to the departmental dose-optimization program which includes automated exposure control, adjustment of the mA and/or kV according to patient size and/or use of iterative reconstruction technique. CONTRAST:  OMNIPAQUE  IOHEXOL  300 MG/ML  SOLN COMPARISON:  06/13/2020 FINDINGS: Lower chest: Dependent changes with ground-glass attenuation and subsegmental atelectasis noted in the lung bases. No pleural fluid or consolidative change. Hepatobiliary: No suspicious liver lesion. Cholecystectomy. The common bile duct measures 1.3 cm in diameter without significant intrahepatic bile duct dilatation. This is unchanged compared with the previous exam. Pancreas: Unremarkable. No pancreatic ductal dilatation or surrounding inflammatory changes. Spleen: Normal in size without focal abnormality. Adrenals/Urinary Tract: Adrenal glands are unremarkable. Kidneys are normal, without renal calculi, focal lesion, or hydronephrosis. Bladder is unremarkable. Stomach/Bowel: The stomach is nondistended. There is a large diverticulum arising off the descending duodenum measuring 5.2 cm, similar to previous exam. Scattered small bowel diverticula are again noted. Within the right paramidline abdomen there  is focal inflammatory changes involving the mid small bowel, image 48/2. There is associated soft tissue stranding and edema extending into the small bowel mesentery. Multiple small bowel diverticula are identified within this region with epicenter of the inflammatory changes surrounding a diverticula, image 46/2. Small foci of extraluminal gas noted within the area of inflammation compatible with contained perforation. No signs of free perforation noted or abscess. Associated mild small bowel wall thickening is noted. No pathologic dilatation of the bowel loops to suggest an obstruction. The appendix is visualized and appears normal. Distal colonic diverticula noted without acute diverticulitis. Vascular/Lymphatic: Aortic atherosclerosis. No aneurysm. Patent abdominal vascularity. No abdominopelvic adenopathy. Reproductive: Uterus and bilateral adnexa are unremarkable. Other: Small volume of free fluid noted within the abdomen and pelvis. No loculated fluid collections identified. No signs of pneumoperitoneum. Musculoskeletal: No acute or significant osseous findings. Lumbar degenerative disc disease is noted at L1-2 and L2-3. The IMPRESSION: 1. Examination is positive for acute diverticulitis involving the mid small bowel. Small foci of extraluminal gas noted within the area of inflammation which may reflect contained perforation. No signs of free perforation noted however. No abscess identified. 2. Small volume of free fluid noted within the abdomen and pelvis. 3. Stable appearance of large duodenal diverticulum. 4. Status post cholecystectomy. Stable dilatation of the common bile duct without significant intrahepatic bile duct dilatation. Findings likely reflect post cholecystectomy physiology. 5.  Aortic Atherosclerosis (ICD10-I70.0). Electronically Signed   By: Waddell Calk M.D.   On: 11/13/2023 08:06    Labs: Basic Metabolic Panel: Recent Labs  Lab 11/13/23 0552 11/14/23 0437  NA 141 143  K 2.9*  3.5  CL 101 108  CO2 30 25  GLUCOSE 133* 128*  BUN 12 14  CREATININE 0.63 0.57  CALCIUM 9.9 8.9  MG 2.0 2.0   CBC: Recent Labs  Lab 11/13/23 0552 11/14/23 0437 11/15/23 0452  WBC 8.5 9.6 9.4  NEUTROABS 6.4  --   --   HGB 16.1* 13.9 13.0  HCT 47.7* 41.5 38.3  MCV 92.6 92.2 93.2  PLT 188 169 156   Microbiology: No results found for this or any previous visit.  Time coordinating discharge: Over 30 minutes  Marien LITTIE Piety, MD  [facility] 11/16/2023, 11:26 AM

## 2023-11-16 NOTE — TOC CM/SW Note (Signed)
 Patient is discharging, MD confirmed no home oxygen need.  Angell Honse, LCSW Transitions of Care Department [phone]

## 2023-11-16 NOTE — Plan of Care (Signed)

## 2023-11-16 NOTE — Plan of Care (Signed)

## 2023-11-16 NOTE — Progress Notes (Signed)
 SATURATION QUALIFICATIONS: (This note is used to comply with regulatory documentation for home oxygen)  Patient Saturations on Room Air at Rest = 94%  Patient Saturations on Room Air while Ambulating = 90-94%   Ann Harris Ann Harris

## 2023-12-02 ENCOUNTER — Other Ambulatory Visit: Payer: Self-pay | Admitting: Internal Medicine

## 2023-12-02 DIAGNOSIS — I10 Essential (primary) hypertension: Secondary | ICD-10-CM

## 2023-12-02 DIAGNOSIS — K5720 Diverticulitis of large intestine with perforation and abscess without bleeding: Secondary | ICD-10-CM

## 2023-12-15 ENCOUNTER — Ambulatory Visit
Admission: RE | Admit: 2023-12-15 | Discharge: 2023-12-15 | Disposition: A | Discharge status: Home / Self Care | Source: Ambulatory Visit | Attending: Internal Medicine | Admitting: Internal Medicine

## 2023-12-15 DIAGNOSIS — K5720 Diverticulitis of large intestine with perforation and abscess without bleeding: Secondary | ICD-10-CM | POA: Diagnosis present

## 2023-12-15 DIAGNOSIS — I10 Essential (primary) hypertension: Secondary | ICD-10-CM | POA: Insufficient documentation

## 2023-12-15 MED ORDER — IOHEXOL 300 MG/ML  SOLN
100.0000 mL | Freq: Once | INTRAMUSCULAR | Status: AC | PRN
  Administered 2023-12-15: 100 mL via INTRAVENOUS
